# Patient Record
Sex: Female | Born: 1990 | Race: White | Hispanic: No | Marital: Married | Smoking: Never smoker
Health system: Southern US, Community
[De-identification: ages and names within clinical notes are randomized; demographics above are authoritative.]

## PROBLEM LIST (undated history)

## (undated) DIAGNOSIS — K219 Gastro-esophageal reflux disease without esophagitis: Secondary | ICD-10-CM

## (undated) DIAGNOSIS — Z8489 Family history of other specified conditions: Secondary | ICD-10-CM

## (undated) HISTORY — PX: TONSILLECTOMY: SUR1361

---

## 2016-09-11 ENCOUNTER — Encounter: Payer: Self-pay | Admitting: *Deleted

## 2016-09-11 ENCOUNTER — Emergency Department
Admission: EM | Admit: 2016-09-11 | Discharge: 2016-09-11 | Disposition: A | Discharge status: Home / Self Care | Attending: Emergency Medicine | Admitting: Emergency Medicine

## 2016-09-11 DIAGNOSIS — S61254A Open bite of right ring finger without damage to nail, initial encounter: Secondary | ICD-10-CM | POA: Insufficient documentation

## 2016-09-11 DIAGNOSIS — Y999 Unspecified external cause status: Secondary | ICD-10-CM | POA: Insufficient documentation

## 2016-09-11 DIAGNOSIS — T148XXA Other injury of unspecified body region, initial encounter: Secondary | ICD-10-CM

## 2016-09-11 DIAGNOSIS — Y929 Unspecified place or not applicable: Secondary | ICD-10-CM | POA: Diagnosis not present

## 2016-09-11 DIAGNOSIS — Y939 Activity, unspecified: Secondary | ICD-10-CM | POA: Insufficient documentation

## 2016-09-11 DIAGNOSIS — W5589XA Other contact with other mammals, initial encounter: Secondary | ICD-10-CM | POA: Insufficient documentation

## 2016-09-11 NOTE — ED Triage Notes (Addendum)
Patient states that last night she was bit by a baby fox on the right 4th finger. Skin was broken. [facility] was contacted and has the animal. Patient was seen at [facility] and prescribed Amoxicillin -POT.

## 2016-09-11 NOTE — ED Notes (Signed)
See triage note. States she was setting a small fox loose from a cage and she was bitten on finger

## 2016-09-11 NOTE — ED Provider Notes (Signed)
[facility] Emergency Department Provider Note  ____________________________________________  Time seen: Approximately 5:33 PM  I have reviewed the triage vital signs and the nursing notes.   HISTORY  Chief Complaint Animal Bite    HPI Monique Pacheco is a 26 y.o. female presenting to the emergency department after being bitten by a fox. Patient sustained two 0.2 cm abrasion to the right ring finger during the encounter. Patient states that Caryn Section is a baby and was given to her by neighbors. Patient states that she was attempting to give baby Caryn Section a bath when she was bitten. No falls occured during the encounter. Baby Caryn Section has been sequestered and given to animal control for observation. Patient was seen at [facility] clinic yesterday and prescribed Augmentin. She presents to the emergency department for more information regarding rabies vaccination. She denies fever or chills.   History reviewed. No pertinent past medical history.  There are no active problems to display for this patient.   Past Surgical History:  Procedure Laterality Date  . TONSILLECTOMY      Prior to Admission medications   Not on File    Allergies Patient has no known allergies.  No family history on file.  Social History Social History  Substance Use Topics  . Smoking status: Never Smoker  . Smokeless tobacco: Never Used  . Alcohol use Yes     Comment: occasionally     Review of Systems  Constitutional: No fever/chills Eyes: No visual changes. No discharge ENT: No upper respiratory complaints. Cardiovascular: no chest pain. Respiratory: no cough. No SOB. Gastrointestinal: No abdominal pain.  No nausea, no vomiting.  No diarrhea.  No constipation. Musculoskeletal: Negative for musculoskeletal pain. Skin: Patient has animal bite. Neurological: Negative for headaches, focal weakness or numbness..  ____________________________________________   PHYSICAL EXAM:  VITAL SIGNS: ED  Triage Vitals  Enc Vitals Group     BP 09/11/16 1553 116/87     Pulse Rate 09/11/16 1553 82     Resp 09/11/16 1553 16     Temp 09/11/16 1553 98.8 F (37.1 C)     Temp src --      SpO2 09/11/16 1553 98 %     Weight 09/11/16 1555 182 lb (82.6 kg)     Height 09/11/16 1555  (1.702 m)     Head Circumference --      Peak Flow --      Pain Score --      Pain Loc --      Pain Edu? --      Excl. in [place]? --      Constitutional: Alert and oriented. Well appearing and in no acute distress. Eyes: Conjunctivae are normal. PERRL. EOMI. Head: Atraumatic.  Cardiovascular: Normal rate, regular rhythm. Normal S1 and S2.  Good peripheral circulation. Respiratory: Normal respiratory effort without tachypnea or retractions. Lungs CTAB. Good air entry to the bases with no decreased or absent breath sounds. Gastrointestinal: Bowel sounds 4 quadrants. Soft and nontender to palpation. No guarding or rigidity. No palpable masses. No distention. No CVA tenderness. Musculoskeletal: Full range of motion to all extremities. No gross deformities appreciated. Neurologic:  Normal speech and language. No gross focal neurologic deficits are appreciated.  Skin: Patient has two 0.2 cm abrasions along the right ring finger.  Psychiatric: Mood and affect are normal. Speech and behavior are normal. Patient exhibits appropriate insight and judgement.   ____________________________________________   LABS (all labs ordered are listed, but only abnormal results are displayed)  Labs Reviewed - No data to display ____________________________________________  EKG   ____________________________________________  RADIOLOGY   No results found.  ____________________________________________    PROCEDURES  Procedure(s) performed:    Procedures    Medications - No data to display   ____________________________________________   INITIAL IMPRESSION / ASSESSMENT AND PLAN / ED COURSE  Pertinent labs  & imaging results that were available during my care of the patient were reviewed by me and considered in my medical decision making (see chart for details).  Review of the [place] CSRS was performed in accordance of the [facility] prior to dispensing any controlled drugs.     Assessment and plan: Animal bite: Patient presents to the emergency department after she was bitten by a young Fox along the right ring finger last night. Patient has a prescription for Augmentin prescribed by [facility]. Due to the superficial nature of abrasions from bite, DG right hand was not warranted at this time. As Caryn Section has been successfully detained by animal control, observation was recommended. Patient agreed with plan of care and has agreed to follow-up with animal control throughout observation process. Vital signs are reassuring at this time. All patient questions were answered. Return precautions were given. Patient voiced understanding regarding these return precautions. ____________________________________________  FINAL CLINICAL IMPRESSION(S) / ED DIAGNOSES  Final diagnoses:  Animal bite      NEW MEDICATIONS STARTED DURING THIS VISIT:  There are no discharge medications for this patient.       This chart was dictated using voice recognition software/Dragon. Despite best efforts to proofread, errors can occur which can change the meaning. Any change was purely unintentional.    Orvil Feil, PA-C 09/11/16 1743    Rockne Menghini, MD 09/11/16 1901

## 2018-02-06 ENCOUNTER — Other Ambulatory Visit: Payer: Self-pay | Admitting: Chiropractic Medicine

## 2018-02-06 DIAGNOSIS — S93401A Sprain of unspecified ligament of right ankle, initial encounter: Secondary | ICD-10-CM

## 2018-11-02 ENCOUNTER — Observation Stay: Payer: No Typology Code available for payment source | Admitting: Anesthesiology

## 2018-11-02 ENCOUNTER — Encounter
Admission: EM | Disposition: A | Discharge status: Home Health | Payer: Self-pay | Source: Home / Self Care | Attending: Internal Medicine

## 2018-11-02 ENCOUNTER — Emergency Department: Payer: No Typology Code available for payment source

## 2018-11-02 ENCOUNTER — Other Ambulatory Visit: Payer: Self-pay | Admitting: Orthopedic Surgery

## 2018-11-02 ENCOUNTER — Encounter: Payer: Self-pay | Admitting: Anesthesiology

## 2018-11-02 ENCOUNTER — Inpatient Hospital Stay
Admission: EM | Admit: 2018-11-02 | Discharge: 2018-11-06 | DRG: 983 | Disposition: A | Discharge status: Home Health | Payer: No Typology Code available for payment source | Attending: Internal Medicine | Admitting: Internal Medicine

## 2018-11-02 ENCOUNTER — Other Ambulatory Visit: Payer: Self-pay

## 2018-11-02 DIAGNOSIS — Z20828 Contact with and (suspected) exposure to other viral communicable diseases: Secondary | ICD-10-CM | POA: Diagnosis present

## 2018-11-02 DIAGNOSIS — E876 Hypokalemia: Secondary | ICD-10-CM | POA: Diagnosis present

## 2018-11-02 DIAGNOSIS — M79651 Pain in right thigh: Secondary | ICD-10-CM | POA: Diagnosis present

## 2018-11-02 DIAGNOSIS — Z87828 Personal history of other (healed) physical injury and trauma: Secondary | ICD-10-CM

## 2018-11-02 DIAGNOSIS — Z6829 Body mass index (BMI) 29.0-29.9, adult: Secondary | ICD-10-CM | POA: Diagnosis not present

## 2018-11-02 DIAGNOSIS — S7010XA Contusion of unspecified thigh, initial encounter: Secondary | ICD-10-CM

## 2018-11-02 DIAGNOSIS — T79A21A Traumatic compartment syndrome of right lower extremity, initial encounter: Principal | ICD-10-CM | POA: Diagnosis present

## 2018-11-02 DIAGNOSIS — W5532XA Struck by other hoof stock, initial encounter: Secondary | ICD-10-CM

## 2018-11-02 DIAGNOSIS — K5900 Constipation, unspecified: Secondary | ICD-10-CM | POA: Diagnosis not present

## 2018-11-02 DIAGNOSIS — E663 Overweight: Secondary | ICD-10-CM | POA: Diagnosis present

## 2018-11-02 DIAGNOSIS — R52 Pain, unspecified: Secondary | ICD-10-CM | POA: Diagnosis present

## 2018-11-02 DIAGNOSIS — I959 Hypotension, unspecified: Secondary | ICD-10-CM | POA: Diagnosis not present

## 2018-11-02 HISTORY — PX: FASCIOTOMY: SHX132

## 2018-11-02 LAB — BASIC METABOLIC PANEL
Anion gap: 9 (ref 5–15)
BUN: 18 mg/dL (ref 6–20)
CO2: 24 mmol/L (ref 22–32)
Calcium: 8.8 mg/dL — ABNORMAL LOW (ref 8.9–10.3)
Chloride: 109 mmol/L (ref 98–111)
Creatinine, Ser: 1 mg/dL (ref 0.44–1.00)
GFR calc Af Amer: >60 mL/min (ref >60)
GFR calc non Af Amer: >60 mL/min (ref >60)
Glucose, Bld: 126 mg/dL — ABNORMAL HIGH (ref 70–99)
Potassium: 3.2 mmol/L — ABNORMAL LOW (ref 3.5–5.1)
Sodium: 142 mmol/L (ref 135–145)

## 2018-11-02 LAB — CBC WITH DIFFERENTIAL/PLATELET
Abs Immature Granulocytes: 0.03 10*3/uL (ref 0.00–0.07)
Basophils Absolute: 0.1 10*3/uL (ref 0.0–0.1)
Basophils Relative: 1 %
Eosinophils Absolute: 0.2 10*3/uL (ref 0.0–0.5)
Eosinophils Relative: 3 %
HCT: 36.6 % (ref 36.0–46.0)
Hemoglobin: 11.9 g/dL — ABNORMAL LOW (ref 12.0–15.0)
Immature Granulocytes: 0 %
Lymphocytes Relative: 36 %
Lymphs Abs: 3.2 10*3/uL (ref 0.7–4.0)
MCH: 28.4 pg (ref 26.0–34.0)
MCHC: 32.5 g/dL (ref 30.0–36.0)
MCV: 87.4 fL (ref 80.0–100.0)
Monocytes Absolute: 0.7 10*3/uL (ref 0.1–1.0)
Monocytes Relative: 8 %
Neutro Abs: 4.5 10*3/uL (ref 1.7–7.7)
Neutrophils Relative %: 52 %
Platelets: 282 10*3/uL (ref 150–400)
RBC: 4.19 MIL/uL (ref 3.87–5.11)
RDW: 12.7 % (ref 11.5–15.5)
WBC: 8.7 10*3/uL (ref 4.0–10.5)
nRBC: 0 % (ref 0.0–0.2)

## 2018-11-02 LAB — SARS CORONAVIRUS 2 BY RT PCR (HOSPITAL ORDER, PERFORMED IN ~~LOC~~ HOSPITAL LAB): SARS Coronavirus 2: NEGATIVE

## 2018-11-02 LAB — PROTIME-INR
INR: 1 (ref 0.8–1.2)
Prothrombin Time: 12.6 seconds (ref 11.4–15.2)

## 2018-11-02 LAB — POCT PREGNANCY, URINE: Preg Test, Ur: NEGATIVE

## 2018-11-02 LAB — CK: Total CK: 262 U/L — ABNORMAL HIGH (ref 38–234)

## 2018-11-02 SURGERY — FASCIOTOMY, UPPER EXTREMITY
Anesthesia: General | Laterality: Right

## 2018-11-02 MED ORDER — KETOROLAC TROMETHAMINE 30 MG/ML IJ SOLN
INTRAMUSCULAR | Status: AC
  Filled 2018-11-02: qty 1

## 2018-11-02 MED ORDER — KETOROLAC TROMETHAMINE 30 MG/ML IJ SOLN
10.0000 mg | Freq: Once | INTRAMUSCULAR | Status: AC
  Administered 2018-11-02: 02:00:00 9.9 mg via INTRAVENOUS
  Filled 2018-11-02: qty 1

## 2018-11-02 MED ORDER — MIDAZOLAM HCL 2 MG/2ML IJ SOLN
INTRAMUSCULAR | Status: DC | PRN
  Administered 2018-11-02: 2 mg via INTRAVENOUS

## 2018-11-02 MED ORDER — ACETAMINOPHEN 325 MG PO TABS
650.0000 mg | ORAL_TABLET | Freq: Four times a day (QID) | ORAL | Status: DC | PRN
  Administered 2018-11-03 – 2018-11-05 (×5): 650 mg via ORAL
  Filled 2018-11-02 (×5): qty 2

## 2018-11-02 MED ORDER — TRAMADOL HCL 50 MG PO TABS
50.0000 mg | ORAL_TABLET | Freq: Four times a day (QID) | ORAL | Status: DC
  Administered 2018-11-02 – 2018-11-06 (×14): 50 mg via ORAL
  Filled 2018-11-02 (×14): qty 1

## 2018-11-02 MED ORDER — HYDROMORPHONE HCL 1 MG/ML IJ SOLN
INTRAMUSCULAR | Status: AC
  Filled 2018-11-02: qty 1

## 2018-11-02 MED ORDER — FENTANYL CITRATE (PF) 100 MCG/2ML IJ SOLN
INTRAMUSCULAR | Status: AC
  Filled 2018-11-02: qty 2

## 2018-11-02 MED ORDER — ONDANSETRON HCL 4 MG/2ML IJ SOLN
4.0000 mg | Freq: Four times a day (QID) | INTRAMUSCULAR | Status: DC | PRN
  Administered 2018-11-05: 4 mg via INTRAVENOUS

## 2018-11-02 MED ORDER — ACETAMINOPHEN 500 MG PO TABS
1000.0000 mg | ORAL_TABLET | Freq: Once | ORAL | Status: AC
  Administered 2018-11-02: 11:00:00 975 mg via ORAL

## 2018-11-02 MED ORDER — CEFAZOLIN SODIUM-DEXTROSE 1-4 GM/50ML-% IV SOLN
1.0000 g | Freq: Four times a day (QID) | INTRAVENOUS | Status: AC
  Administered 2018-11-02 (×2): 1 g via INTRAVENOUS
  Filled 2018-11-02 (×2): qty 50

## 2018-11-02 MED ORDER — PHENYLEPHRINE HCL (PRESSORS) 10 MG/ML IV SOLN
INTRAVENOUS | Status: AC
  Filled 2018-11-02: qty 1

## 2018-11-02 MED ORDER — OXYCODONE HCL 5 MG PO TABS
5.0000 mg | ORAL_TABLET | Freq: Four times a day (QID) | ORAL | Status: DC | PRN
  Administered 2018-11-02: 5 mg via ORAL
  Filled 2018-11-02: qty 1

## 2018-11-02 MED ORDER — DIAZEPAM 5 MG PO TABS
5.0000 mg | ORAL_TABLET | Freq: Once | ORAL | Status: AC
  Administered 2018-11-02: 02:00:00 5 mg via ORAL
  Filled 2018-11-02: qty 1

## 2018-11-02 MED ORDER — ALUM & MAG HYDROXIDE-SIMETH 200-200-20 MG/5ML PO SUSP: 30.0000 mL | ORAL | Status: DC | PRN

## 2018-11-02 MED ORDER — ACETAMINOPHEN 650 MG RE SUPP: 650.0000 mg | Freq: Four times a day (QID) | RECTAL | Status: DC | PRN

## 2018-11-02 MED ORDER — ACETAMINOPHEN 500 MG PO TABS
1000.0000 mg | ORAL_TABLET | Freq: Four times a day (QID) | ORAL | Status: AC
  Administered 2018-11-02 – 2018-11-03 (×3): 1000 mg via ORAL
  Filled 2018-11-02 (×3): qty 2

## 2018-11-02 MED ORDER — LACTATED RINGERS IV SOLN
INTRAVENOUS | Status: DC
  Administered 2018-11-02: 09:00:00 50 mL/h via INTRAVENOUS

## 2018-11-02 MED ORDER — OXYCODONE-ACETAMINOPHEN 5-325 MG PO TABS
1.0000 | ORAL_TABLET | Freq: Four times a day (QID) | ORAL | Status: DC | PRN
  Administered 2018-11-02: 1 via ORAL
  Filled 2018-11-02: qty 1

## 2018-11-02 MED ORDER — BISACODYL 10 MG RE SUPP
10.0000 mg | Freq: Every day | RECTAL | Status: DC | PRN
  Administered 2018-11-05: 22:00:00 10 mg via RECTAL
  Filled 2018-11-02: qty 1

## 2018-11-02 MED ORDER — OXYCODONE HCL 5 MG PO TABS
10.0000 mg | ORAL_TABLET | ORAL | Status: DC | PRN
  Administered 2018-11-02 – 2018-11-06 (×6): 10 mg via ORAL
  Filled 2018-11-02 (×5): qty 2

## 2018-11-02 MED ORDER — DOCUSATE SODIUM 100 MG PO CAPS
100.0000 mg | ORAL_CAPSULE | Freq: Two times a day (BID) | ORAL | Status: DC
  Filled 2018-11-02: qty 1

## 2018-11-02 MED ORDER — HYDROMORPHONE HCL 1 MG/ML IJ SOLN
1.0000 mg | Freq: Once | INTRAMUSCULAR | Status: AC
  Administered 2018-11-02: 1 mg via INTRAVENOUS

## 2018-11-02 MED ORDER — ONDANSETRON HCL 4 MG PO TABS: 4.0000 mg | ORAL_TABLET | Freq: Four times a day (QID) | ORAL | Status: DC | PRN

## 2018-11-02 MED ORDER — HYDROMORPHONE HCL 1 MG/ML IJ SOLN
1.0000 mg | Freq: Once | INTRAMUSCULAR | Status: AC
  Administered 2018-11-02: 01:00:00 1 mg via INTRAVENOUS

## 2018-11-02 MED ORDER — HYDROMORPHONE HCL 1 MG/ML IJ SOLN: 1.0000 mg | Freq: Once | INTRAMUSCULAR | Status: DC

## 2018-11-02 MED ORDER — OXYCODONE HCL 5 MG PO TABS
5.0000 mg | ORAL_TABLET | ORAL | Status: DC | PRN
  Administered 2018-11-02 – 2018-11-05 (×5): 10 mg via ORAL
  Administered 2018-11-05 (×2): 5 mg via ORAL
  Filled 2018-11-02 (×2): qty 2
  Filled 2018-11-02: qty 1
  Filled 2018-11-02 (×4): qty 2
  Filled 2018-11-02: qty 1

## 2018-11-02 MED ORDER — ONDANSETRON HCL 4 MG/2ML IJ SOLN
4.0000 mg | Freq: Four times a day (QID) | INTRAMUSCULAR | Status: DC | PRN
  Administered 2018-11-02: 4 mg via INTRAVENOUS

## 2018-11-02 MED ORDER — PROPOFOL 10 MG/ML IV BOLUS
INTRAVENOUS | Status: AC
  Filled 2018-11-02: qty 20

## 2018-11-02 MED ORDER — CEFAZOLIN SODIUM-DEXTROSE 1-4 GM/50ML-% IV SOLN
INTRAVENOUS | Status: DC | PRN
  Administered 2018-11-02: 2 g via INTRAVENOUS

## 2018-11-02 MED ORDER — PANTOPRAZOLE SODIUM 40 MG PO TBEC
40.0000 mg | DELAYED_RELEASE_TABLET | Freq: Every day | ORAL | Status: DC
  Administered 2018-11-03 – 2018-11-05 (×3): 40 mg via ORAL
  Filled 2018-11-02 (×3): qty 1

## 2018-11-02 MED ORDER — ONDANSETRON HCL 4 MG/2ML IJ SOLN
INTRAMUSCULAR | Status: AC
  Filled 2018-11-02: qty 2

## 2018-11-02 MED ORDER — HYDROMORPHONE HCL 1 MG/ML IJ SOLN
0.5000 mg | INTRAMUSCULAR | Status: DC | PRN
  Administered 2018-11-02: 11:00:00 0.5 mg via INTRAVENOUS

## 2018-11-02 MED ORDER — FENTANYL CITRATE (PF) 100 MCG/2ML IJ SOLN
25.0000 ug | INTRAMUSCULAR | Status: DC | PRN
  Administered 2018-11-02 (×3): 50 ug via INTRAVENOUS

## 2018-11-02 MED ORDER — FERROUS SULFATE 325 (65 FE) MG PO TABS
325.0000 mg | ORAL_TABLET | Freq: Three times a day (TID) | ORAL | Status: DC
  Administered 2018-11-02 – 2018-11-06 (×9): 325 mg via ORAL
  Filled 2018-11-02 (×10): qty 1

## 2018-11-02 MED ORDER — LIDOCAINE HCL (CARDIAC) PF 100 MG/5ML IV SOSY
PREFILLED_SYRINGE | INTRAVENOUS | Status: DC | PRN
  Administered 2018-11-02: 100 mg via INTRAVENOUS

## 2018-11-02 MED ORDER — MENTHOL 3 MG MT LOZG
1.0000 | LOZENGE | OROMUCOSAL | Status: DC | PRN
  Filled 2018-11-02: qty 9

## 2018-11-02 MED ORDER — HYDROMORPHONE HCL 1 MG/ML IJ SOLN
INTRAMUSCULAR | Status: AC
  Administered 2018-11-02: 21:00:00 1 mg
  Filled 2018-11-02: qty 1

## 2018-11-02 MED ORDER — POTASSIUM CHLORIDE CRYS ER 20 MEQ PO TBCR
40.0000 meq | EXTENDED_RELEASE_TABLET | Freq: Once | ORAL | Status: AC
  Administered 2018-11-02: 40 meq via ORAL
  Filled 2018-11-02: qty 2

## 2018-11-02 MED ORDER — DEXAMETHASONE SODIUM PHOSPHATE 10 MG/ML IJ SOLN
INTRAMUSCULAR | Status: DC | PRN
  Administered 2018-11-02: 8 mg via INTRAVENOUS

## 2018-11-02 MED ORDER — DOCUSATE SODIUM 100 MG PO CAPS
100.0000 mg | ORAL_CAPSULE | Freq: Two times a day (BID) | ORAL | Status: DC
  Administered 2018-11-02 – 2018-11-06 (×8): 100 mg via ORAL
  Filled 2018-11-02 (×8): qty 1

## 2018-11-02 MED ORDER — DEXAMETHASONE SODIUM PHOSPHATE 10 MG/ML IJ SOLN
INTRAMUSCULAR | Status: AC
  Filled 2018-11-02: qty 1

## 2018-11-02 MED ORDER — ONDANSETRON HCL 4 MG/2ML IJ SOLN: 4.0000 mg | Freq: Once | INTRAMUSCULAR | Status: DC | PRN

## 2018-11-02 MED ORDER — METHOCARBAMOL 1000 MG/10ML IJ SOLN
500.0000 mg | Freq: Four times a day (QID) | INTRAVENOUS | Status: DC | PRN
  Filled 2018-11-02: qty 5

## 2018-11-02 MED ORDER — MORPHINE SULFATE (PF) 4 MG/ML IV SOLN
4.0000 mg | INTRAVENOUS | Status: DC | PRN
  Administered 2018-11-02 (×2): 4 mg via INTRAVENOUS
  Filled 2018-11-02 (×2): qty 1

## 2018-11-02 MED ORDER — ACETAMINOPHEN 325 MG PO TABS
ORAL_TABLET | ORAL | Status: AC
  Filled 2018-11-02: qty 3

## 2018-11-02 MED ORDER — FENTANYL CITRATE (PF) 100 MCG/2ML IJ SOLN
INTRAMUSCULAR | Status: DC | PRN
  Administered 2018-11-02: 100 ug via INTRAVENOUS

## 2018-11-02 MED ORDER — MAGNESIUM CITRATE PO SOLN
1.0000 | Freq: Once | ORAL | Status: DC | PRN
  Filled 2018-11-02: qty 296

## 2018-11-02 MED ORDER — MIDAZOLAM HCL 2 MG/2ML IJ SOLN
INTRAMUSCULAR | Status: AC
  Filled 2018-11-02: qty 2

## 2018-11-02 MED ORDER — KETOROLAC TROMETHAMINE 30 MG/ML IJ SOLN
30.0000 mg | Freq: Once | INTRAMUSCULAR | Status: AC
  Administered 2018-11-02: 11:00:00 30 mg via INTRAVENOUS

## 2018-11-02 MED ORDER — POLYETHYLENE GLYCOL 3350 17 G PO PACK: 17.0000 g | PACK | Freq: Every day | ORAL | Status: DC | PRN

## 2018-11-02 MED ORDER — HYDROMORPHONE HCL 1 MG/ML IJ SOLN
0.5000 mg | INTRAMUSCULAR | Status: DC | PRN
  Administered 2018-11-02 – 2018-11-03 (×6): 1 mg via INTRAVENOUS
  Filled 2018-11-02 (×6): qty 1

## 2018-11-02 MED ORDER — KETOROLAC TROMETHAMINE 15 MG/ML IJ SOLN
7.5000 mg | Freq: Four times a day (QID) | INTRAMUSCULAR | Status: AC
  Administered 2018-11-02 – 2018-11-03 (×3): 7.5 mg via INTRAVENOUS
  Filled 2018-11-02 (×3): qty 1

## 2018-11-02 MED ORDER — METHOCARBAMOL 500 MG PO TABS
500.0000 mg | ORAL_TABLET | Freq: Four times a day (QID) | ORAL | Status: DC | PRN
  Administered 2018-11-02 – 2018-11-05 (×6): 500 mg via ORAL
  Filled 2018-11-02 (×8): qty 1

## 2018-11-02 MED ORDER — ONDANSETRON HCL 4 MG/2ML IJ SOLN: 4.0000 mg | Freq: Once | INTRAMUSCULAR | Status: DC

## 2018-11-02 MED ORDER — ENOXAPARIN SODIUM 40 MG/0.4ML ~~LOC~~ SOLN
40.0000 mg | SUBCUTANEOUS | Status: DC
  Administered 2018-11-03: 40 mg via SUBCUTANEOUS
  Filled 2018-11-02: qty 0.4

## 2018-11-02 MED ORDER — ONDANSETRON HCL 4 MG/2ML IJ SOLN
4.0000 mg | Freq: Once | INTRAMUSCULAR | Status: AC
  Administered 2018-11-02: 01:00:00 4 mg via INTRAVENOUS

## 2018-11-02 MED ORDER — SODIUM CHLORIDE 0.9 % IV BOLUS
1000.0000 mL | Freq: Once | INTRAVENOUS | Status: AC
  Administered 2018-11-02: 1000 mL via INTRAVENOUS

## 2018-11-02 MED ORDER — PHENOL 1.4 % MT LIQD
1.0000 | OROMUCOSAL | Status: DC | PRN
  Filled 2018-11-02: qty 177

## 2018-11-02 SURGICAL SUPPLY — 42 items
BANDAGE ACE 6X5 VEL STRL LF (GAUZE/BANDAGES/DRESSINGS) ×3 IMPLANT
BLADE SURG 15 STRL LF DISP TIS (BLADE) ×1 IMPLANT
BLADE SURG 15 STRL SS (BLADE) ×2
BNDG ESMARK 4X12 TAN STRL LF (GAUZE/BANDAGES/DRESSINGS) IMPLANT
CANISTER SUCT 1200ML W/VALVE (MISCELLANEOUS) ×3 IMPLANT
CANISTER SUCT 3000ML PPV (MISCELLANEOUS) ×3 IMPLANT
CANISTER WOUND CARE 500ML ATS (WOUND CARE) ×3 IMPLANT
CAST PADDING 3X4FT ST 30246 (SOFTGOODS)
COVER WAND RF STERILE (DRAPES) ×3 IMPLANT
CUFF TOURN SGL QUICK 24 (TOURNIQUET CUFF)
CUFF TOURN SGL QUICK 30 (TOURNIQUET CUFF)
CUFF TRNQT CYL 24X4X16.5-23 (TOURNIQUET CUFF) IMPLANT
CUFF TRNQT CYL 30X4X21-28X (TOURNIQUET CUFF) IMPLANT
DRAPE SURG 17X11 SM STRL (DRAPES) ×6 IMPLANT
DRAPE U-SHAPE 47X51 STRL (DRAPES) IMPLANT
DRAPE WOUND VAC 10X15X1CM (MISCELLANEOUS) ×3 IMPLANT
DRSG VAC ATS LRG SENSATRAC (GAUZE/BANDAGES/DRESSINGS) ×3 IMPLANT
DURAPREP 26ML APPLICATOR (WOUND CARE) ×9 IMPLANT
ELECT REM PT RETURN 9FT ADLT (ELECTROSURGICAL) ×3
ELECTRODE REM PT RTRN 9FT ADLT (ELECTROSURGICAL) ×1 IMPLANT
GAUZE SPONGE 4X4 12PLY STRL (GAUZE/BANDAGES/DRESSINGS) IMPLANT
GAUZE XEROFORM 1X8 LF (GAUZE/BANDAGES/DRESSINGS) IMPLANT
GLOVE BIOGEL PI IND STRL 9 (GLOVE) ×1 IMPLANT
GLOVE BIOGEL PI INDICATOR 9 (GLOVE) ×2
GLOVE SURG 9.0 ORTHO LTXF (GLOVE) ×9 IMPLANT
GOWN STRL REUS TWL 2XL XL LVL4 (GOWN DISPOSABLE) ×3 IMPLANT
GOWN STRL REUS W/ TWL LRG LVL3 (GOWN DISPOSABLE) ×1 IMPLANT
GOWN STRL REUS W/TWL LRG LVL3 (GOWN DISPOSABLE) ×2
MAT ABSORB  FLUID 56X50 GRAY (MISCELLANEOUS)
MAT ABSORB FLUID 56X50 GRAY (MISCELLANEOUS) IMPLANT
NS IRRIG 1000ML POUR BTL (IV SOLUTION) ×3 IMPLANT
PACK EXTREMITY ARMC (MISCELLANEOUS) IMPLANT
PACK HIP PROSTHESIS (MISCELLANEOUS) ×3 IMPLANT
PAD ABD DERMACEA PRESS 5X9 (GAUZE/BANDAGES/DRESSINGS) ×3 IMPLANT
PAD CAST CTTN 3X4 STRL (SOFTGOODS) IMPLANT
PULSAVAC PLUS IRRIG FAN TIP (DISPOSABLE)
SET MONITOR QUICK PRESSURE (MISCELLANEOUS) ×3 IMPLANT
SOL .9 NS 3000ML IRR  AL (IV SOLUTION)
SOL .9 NS 3000ML IRR UROMATIC (IV SOLUTION) IMPLANT
STOCKINETTE BIAS CUT 6 980064 (GAUZE/BANDAGES/DRESSINGS) ×3 IMPLANT
STOCKINETTE STRL 4IN 9604848 (GAUZE/BANDAGES/DRESSINGS) IMPLANT
TIP FAN IRRIG PULSAVAC PLUS (DISPOSABLE) IMPLANT
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
## 2018-11-02 NOTE — ED Notes (Signed)
ED Provider at bedside. 
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
## 2018-11-02 NOTE — Anesthesia Postprocedure Evaluation (Signed)
Anesthesia Post Note  Patient: Monique Pacheco  Procedure(s) Performed: FASCIOTOMY RIGHT THIGH (Right )  Patient location during evaluation: PACU Anesthesia Type: General Level of consciousness: awake and alert Pain management: pain level controlled Vital Signs Assessment: post-procedure vital signs reviewed and stable Respiratory status: spontaneous breathing, nonlabored ventilation, respiratory function stable and patient connected to nasal cannula oxygen Cardiovascular status: blood pressure returned to baseline and stable Postop Assessment: no apparent nausea or vomiting Anesthetic complications: no     Last Vitals:  Vitals:   11/02/18 1110 11/02/18 1148  BP: 104/69 115/81  Pulse: 62 60  Resp:  (!) 21  Temp: (!) 36.4 C   SpO2: 100% 100%    Last Pain:  Vitals:   11/02/18 1303  TempSrc:   PainSc: 9         RLE Motor Response: Purposeful movement (11/02/18 1254) RLE Sensation: Pain (11/02/18 1254)      Leslyn Monda S
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
## 2018-11-02 NOTE — Anesthesia Procedure Notes (Signed)
Procedure Name: LMA Insertion Date/Time: 11/02/2018 9:24 AM Performed by: Rona Ravens, CRNA Pre-anesthesia Checklist: Patient identified, Emergency Drugs available, Suction available and Patient being monitored Patient Re-evaluated:Patient Re-evaluated prior to induction Oxygen Delivery Method: Circle system utilized Preoxygenation: Pre-oxygenation with 100% oxygen Induction Type: IV induction Ventilation: Mask ventilation without difficulty LMA: LMA inserted LMA Size: 4.5 Number of attempts: 1 Dental Injury: Teeth and Oropharynx as per pre-operative assessment

## 2018-11-02 NOTE — Plan of Care (Signed)
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
## 2018-11-02 NOTE — Anesthesia Post-op Follow-up Note (Signed)
Anesthesia QCDR form completed.        
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
## 2018-11-02 NOTE — Transfer of Care (Signed)
Immediate Anesthesia Transfer of Care Note  Patient: Monique Pacheco  Procedure(s) Performed: FASCIOTOMY RIGHT THIGH (Right )  Patient Location: PACU  Anesthesia Type:General  Level of Consciousness: awake, alert  and oriented  Airway & Oxygen Therapy: Patient Spontanous Breathing and Patient connected to face mask oxygen  Post-op Assessment: Report given to RN and Post -op Vital signs reviewed and stable  Post vital signs: Reviewed and stable  Last Vitals:  Vitals Value Taken Time  BP 129/63 11/02/18 1015  Temp    Pulse 88 11/02/18 1016  Resp    SpO2 100 % 11/02/18 1016  Vitals shown include unvalidated device data.  Last Pain:  Vitals:   11/02/18 0808  TempSrc:   PainSc: 7       Patients Stated Pain Goal: 1 (59/93/57 0177)  Complications: No apparent anesthesia complications

## 2018-11-02 NOTE — Anesthesia Preprocedure Evaluation (Signed)
Anesthesia Evaluation  Patient identified by MRN, date of birth, ID band Patient awake    Reviewed: Allergy & Precautions, NPO status , Patient's Chart, lab work & pertinent test results, reviewed documented beta blocker date and time   Airway Mallampati: III  TM Distance: >3 FB     Dental  (+) Chipped   Pulmonary           Cardiovascular      Neuro/Psych    GI/Hepatic   Endo/Other    Renal/GU      Musculoskeletal   Abdominal   Peds  Hematology   Anesthesia Other Findings   Reproductive/Obstetrics                             Anesthesia Physical Anesthesia Plan  ASA: II  Anesthesia Plan: General   Post-op Pain Management:    Induction: Intravenous  PONV Risk Score and Plan:   Airway Management Planned: LMA  Additional Equipment:   Intra-op Plan:   Post-operative Plan:   Informed Consent: I have reviewed the patients History and Physical, chart, labs and discussed the procedure including the risks, benefits and alternatives for the proposed anesthesia with the patient or authorized representative who has indicated his/her understanding and acceptance.       Plan Discussed with: CRNA  Anesthesia Plan Comments:         Anesthesia Quick Evaluation  
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
## 2018-11-03 ENCOUNTER — Encounter: Payer: Self-pay | Admitting: Orthopedic Surgery

## 2018-11-03 LAB — BASIC METABOLIC PANEL
Anion gap: 9 (ref 5–15)
BUN: 12 mg/dL (ref 6–20)
CO2: 21 mmol/L — ABNORMAL LOW (ref 22–32)
Calcium: 8.1 mg/dL — ABNORMAL LOW (ref 8.9–10.3)
Chloride: 108 mmol/L (ref 98–111)
Creatinine, Ser: 0.8 mg/dL (ref 0.44–1.00)
GFR calc Af Amer: >60 mL/min (ref >60)
GFR calc non Af Amer: >60 mL/min (ref >60)
Glucose, Bld: 121 mg/dL — ABNORMAL HIGH (ref 70–99)
Potassium: 4.4 mmol/L (ref 3.5–5.1)
Sodium: 138 mmol/L (ref 135–145)

## 2018-11-03 LAB — CBC
HCT: 30.3 % — ABNORMAL LOW (ref 36.0–46.0)
Hemoglobin: 9.9 g/dL — ABNORMAL LOW (ref 12.0–15.0)
MCH: 28.6 pg (ref 26.0–34.0)
MCHC: 32.7 g/dL (ref 30.0–36.0)
MCV: 87.6 fL (ref 80.0–100.0)
Platelets: 222 10*3/uL (ref 150–400)
RBC: 3.46 MIL/uL — ABNORMAL LOW (ref 3.87–5.11)
RDW: 12.5 % (ref 11.5–15.5)
WBC: 11.4 10*3/uL — ABNORMAL HIGH (ref 4.0–10.5)
nRBC: 0 % (ref 0.0–0.2)

## 2018-11-03 MED ORDER — SODIUM CHLORIDE 0.9 % IV BOLUS
500.0000 mL | Freq: Once | INTRAVENOUS | Status: AC
  Administered 2018-11-03: 500 mL via INTRAVENOUS

## 2018-11-03 MED ORDER — SODIUM CHLORIDE 0.9 % IV SOLN
INTRAVENOUS | Status: DC
  Administered 2018-11-03 – 2018-11-06 (×4): via INTRAVENOUS

## 2018-11-03 MED ORDER — CEFAZOLIN SODIUM-DEXTROSE 1-4 GM/50ML-% IV SOLN
1.0000 g | Freq: Three times a day (TID) | INTRAVENOUS | Status: DC
  Administered 2018-11-03 – 2018-11-06 (×9): 1 g via INTRAVENOUS
  Filled 2018-11-03 (×11): qty 50

## 2018-11-03 MED ORDER — POLYETHYLENE GLYCOL 3350 17 G PO PACK
17.0000 g | PACK | Freq: Every day | ORAL | Status: DC
  Administered 2018-11-03 – 2018-11-05 (×2): 17 g via ORAL
  Filled 2018-11-03 (×3): qty 1
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
## 2018-11-03 NOTE — Progress Notes (Signed)
Notified Dr. Estanislado Pandy of patient's BP and being dizzy. Order received for normal saline bolus 500 ml and Normal saline at 158ml/hr

## 2018-11-03 NOTE — Progress Notes (Signed)
Notified Dr. Donney Rankins of patient's complaint that "something doesn't feel right." patient has good pedal pulses and able to wiggler her toes. Telephone call transferred into room for MD to speak with patient.
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
## 2018-11-03 NOTE — Progress Notes (Signed)
[employer] - [place] [facility]   PATIENT NAME: Monique Pacheco    MR#:  [id number]  DATE OF BIRTH:  06/18/1990  SUBJECTIVE:  CHIEF COMPLAINT:   Chief Complaint  Patient presents with  . Leg Injury  Patient seen and evaluated today Patient has pain in the Right thigh Swelling in the right thigh better No complaints of chest pain and shortness of breath  REVIEW OF SYSTEMS:    ROS  CONSTITUTIONAL: No documented fever. No fatigue, weakness. No weight gain, no weight loss.  EYES: No blurry or double vision.  ENT: No tinnitus. No postnasal drip. No redness of the oropharynx.  RESPIRATORY: No cough, no wheeze, no hemoptysis. No dyspnea.  CARDIOVASCULAR: No chest pain. No orthopnea. No palpitations. No syncope.  GASTROINTESTINAL: No nausea, no vomiting or diarrhea. No abdominal pain. No melena or hematochezia.  GENITOURINARY: No dysuria or hematuria.  ENDOCRINE: No polyuria or nocturia. No heat or cold intolerance.  HEMATOLOGY: No anemia. No bruising. No bleeding.  INTEGUMENTARY: No rashes. No lesions.  MUSCULOSKELETAL: No arthritis. No swelling. No gout.  Tenderness right thigh NEUROLOGIC: No numbness, tingling, or ataxia. No seizure-type activity.  PSYCHIATRIC: No anxiety. No insomnia. No ADD.   DRUG ALLERGIES:  No Known Allergies  VITALS:  Blood pressure 137/68, pulse 71, temperature 97.7 F (36.5 C), resp. rate 20, height 5\' 7"  (1.702 m), weight 86 kg, last menstrual period 11/02/2018, SpO2 100 %.  PHYSICAL EXAMINATION:   Physical Exam  GENERAL:  28 y.o.-year-old patient lying in the bed with no acute distress.  EYES: Pupils equal, round, reactive to light and accommodation. No scleral icterus. Extraocular muscles intact.  HEENT: Head atraumatic, normocephalic. Oropharynx and nasopharynx clear.  NECK:  Supple, no jugular venous distention. No thyroid enlargement, no tenderness.  LUNGS: Normal breath sounds bilaterally, no wheezing, rales, rhonchi. No use  of accessory muscles of respiration.  CARDIOVASCULAR: S1, S2 normal. No murmurs, rubs, or gallops.  ABDOMEN: Soft, nontender, nondistended. Bowel sounds present. No organomegaly or mass.  EXTREMITIES: No cyanosis, clubbing or edema b/l.    Swelling and tenderness in the right thigh Right thigh bandage and drain noted. Pedal pulses present NEUROLOGIC: Cranial nerves II through XII are intact. No focal Motor or sensory deficits b/l.   PSYCHIATRIC: The patient is alert and oriented x 3.  SKIN: Right thigh bandage noted.  LABORATORY PANEL:   CBC Recent Labs  Lab 11/03/18 0255  WBC 11.4*  HGB 9.9*  HCT 30.3*  PLT 222   ------------------------------------------------------------------------------------------------------------------ Chemistries  Recent Labs  Lab 11/03/18 0255  NA 138  K 4.4  CL 108  CO2 21*  GLUCOSE 121*  BUN 12  CREATININE 0.80  CALCIUM 8.1*   ------------------------------------------------------------------------------------------------------------------  Cardiac Enzymes No results for input(s): TROPONINI in the last 168 hours. ------------------------------------------------------------------------------------------------------------------  RADIOLOGY:  Dg Femur Portable Min 2 Views Right  Result Date: 11/02/2018 CLINICAL DATA:  Kicked by camel EXAM: RIGHT FEMUR PORTABLE 2 VIEW COMPARISON:  None. FINDINGS: There is no evidence of fracture or other focal bone lesions. Soft tissues are unremarkable. IMPRESSION: Negative. Electronically Signed   By: Charlett NoseKevin  Dover M.D.   On: 11/02/2018 01:19     ASSESSMENT AND PLAN:   28 year old female patient with no significant past medical history currently under hospitalist service for hematoma of the thigh  -Right thigh compartment syndrome S/p Fasciotomy Status post orthopedic surgery evaluation  -Acute hypokalemia resolved  -Right thigh pain secondary to compartment syndrome IV dilaudid as needed for pain  and prn oral narcotics  -DVT prophylaxis sequential compression device to lower extremities  -COVID-19 test negative  All the records are reviewed and case discussed with Care Management/Social Worker. Management plans discussed with the patient, family and they are in agreement.  CODE STATUS: Full code  DVT Prophylaxis: SCDs  TOTAL TIME TAKING CARE OF THIS PATIENT: 34 minutes.   POSSIBLE D/C IN 2 to 3 DAYS, DEPENDING ON CLINICAL CONDITION.  Saundra Shelling M.D on 11/03/2018 at 9:53 AM  Between 7am to 6pm - Pager - [phone]  After 6pm go to [email or web address] - password [email or web address] [facility]  Office  [phone]  CC: Primary care physician; Clinic, [facility]  Note: This dictation was prepared with Dragon dictation along with smaller phrase technology. Any transcriptional errors that result from this process are unintentional.

## 2018-11-03 NOTE — Progress Notes (Signed)
MD Pyreddy stated verbally that it was okay to give patient dilaudid as needed because compartment syndrome surgery is very painful

## 2018-11-03 NOTE — Progress Notes (Signed)
Subjective:  Called by patient's nurse regarding numerous complaints by the patient.  Patient is complaining of dizziness and has been given a bolus and started on 100 cc/h maintenance fluids.  Patient is complaining of pain in her right thigh.  Patient states she has difficulty getting comfortable.  She is receiving narcotics which do help her pain but may be contributing to her decrease in pressure.    Objective:   VITALS:   Vitals:   11/03/18 0613 11/03/18 0743 11/03/18 1441 11/03/18 1639  BP: 127/72 137/68 (!) 104/50 104/66  Pulse: 89 71 (!) 57 66  Resp: 20 20 16    Temp: 98.2 F (36.8 C) 97.7 F (36.5 C) 98 F (36.7 C)   TempSrc: Oral  Oral   SpO2: 99% 100% 98%   Weight: 86 kg     Height:        PHYSICAL EXAM: Right lower extremity: Patient's thigh and leg compartments are soft and compressible.  Her right thigh remains swollen but is improved compared to her preop condition.  The VAC dressing is in place and is functioning.  Patient continues to have tenderness to palpation over her quadriceps globally.  She has palpable pedal pulses.  Patient can flex and extend her toes and can gently dorsiflex and plantarflex her ankle.  Her toes are well-perfused.  She has intact sensation light touch but states that she feels tingling in her toes.   LABS  Results for orders placed or performed during the hospital encounter of 11/02/18 (from the past 24 hour(s))  CBC     Status: Abnormal   Collection Time: 11/03/18  2:55 AM  Result Value Ref Range   WBC 11.4 (H) 4.0 - 10.5 K/uL   RBC 3.46 (L) 3.87 - 5.11 MIL/uL   Hemoglobin 9.9 (L) 12.0 - 15.0 g/dL   HCT 16.130.3 (L) 09.636.0 - 04.546.0 %   MCV 87.6 80.0 - 100.0 fL   MCH 28.6 26.0 - 34.0 pg   MCHC 32.7 30.0 - 36.0 g/dL   RDW 40.912.5 81.111.5 - 91.415.5 %   Platelets 222 150 - 400 K/uL   nRBC 0.0 0.0 - 0.2 %  Basic metabolic panel     Status: Abnormal   Collection Time: 11/03/18  2:55 AM  Result Value Ref Range   Sodium 138 135 - 145 mmol/L   Potassium 4.4 3.5 - 5.1 mmol/L   Chloride 108 98 - 111 mmol/L   CO2 21 (L) 22 - 32 mmol/L   Glucose, Bld 121 (H) 70 - 99 mg/dL   BUN 12 6 - 20 mg/dL   Creatinine, Ser 7.820.80 0.44 - 1.00 mg/dL   Calcium 8.1 (L) 8.9 - 10.3 mg/dL   GFR calc non Af Amer >60 >60 mL/min   GFR calc Af Amer >60 >60 mL/min   Anion gap 9 5 - 15    Dg Femur Portable Min 2 Views Right  Result Date: 11/02/2018 CLINICAL DATA:  Kicked by camel EXAM: RIGHT FEMUR PORTABLE 2 VIEW COMPARISON:  None. FINDINGS: There is no evidence of fracture or other focal bone lesions. Soft tissues are unremarkable. IMPRESSION: Negative. Electronically Signed   By: Charlett NoseKevin  Dover M.D.   On: 11/02/2018 01:19    Assessment/Plan: 1 Day Post-Op   Active Problems:   Intractable pain  Patient continues to deal with significant right lower extremity pain.  There is no clinical evidence of compartment syndrome in the thigh or leg at this time.  Her compartments are soft on exam.  She remains neurovascular intact.  I explained to the patient that she had significant ecchymosis throughout the quadriceps and the muscles swelling from the trauma is likely was causing her pain.  She is written for narcotics and muscle relaxants to help with pain.  I will put physical therapy/Occupational Therapy on hold for now in an effort to improve her pain.  Patient may apply ice to her right lower extremity to improve pain and swelling.  Continue close neurovascular monitoring by the nursing staff.    Thornton Park , MD 11/03/2018, 6:31 PM

## 2018-11-04 LAB — BASIC METABOLIC PANEL
Anion gap: 5 (ref 5–15)
BUN: 13 mg/dL (ref 6–20)
CO2: 24 mmol/L (ref 22–32)
Calcium: 7.6 mg/dL — ABNORMAL LOW (ref 8.9–10.3)
Chloride: 112 mmol/L — ABNORMAL HIGH (ref 98–111)
Creatinine, Ser: 0.88 mg/dL (ref 0.44–1.00)
GFR calc Af Amer: >60 mL/min (ref >60)
GFR calc non Af Amer: >60 mL/min (ref >60)
Glucose, Bld: 104 mg/dL — ABNORMAL HIGH (ref 70–99)
Potassium: 3.8 mmol/L (ref 3.5–5.1)
Sodium: 141 mmol/L (ref 135–145)

## 2018-11-04 LAB — CBC
HCT: 25.8 % — ABNORMAL LOW (ref 36.0–46.0)
Hemoglobin: 8.1 g/dL — ABNORMAL LOW (ref 12.0–15.0)
MCH: 28.6 pg (ref 26.0–34.0)
MCHC: 31.4 g/dL (ref 30.0–36.0)
MCV: 91.2 fL (ref 80.0–100.0)
Platelets: 181 10*3/uL (ref 150–400)
RBC: 2.83 MIL/uL — ABNORMAL LOW (ref 3.87–5.11)
RDW: 12.8 % (ref 11.5–15.5)
WBC: 6.5 10*3/uL (ref 4.0–10.5)
nRBC: 0 % (ref 0.0–0.2)

## 2018-11-04 NOTE — TOC Progression Note (Signed)
Transition of Care [facility]) - Progression Note    Patient Details  Name: Monique Pacheco MRN: [id number] Date of Birth: November 27, 1990  Transition of Care [facility]) CM/SW [place], RN Phone Number: 11/04/2018, 2:09 PM  Clinical Narrative:     The patient called the [facility] and obtained the Social Worker contact Ralene Bathe, [phone] ext [phone], she stated that her last name at the [place] is Jimmye [place] since they have not updated her name using her married name.  I called Marlon at the number above, I left a detailed secure [place] mail explaining that I was trying to set up the patient with Home health Services and I need to know if they have a Agency that the patient is required to use or not and if so, who is it and contact information, I requested a call back and provided my contact information  Expected Discharge Plan: [place] Barriers to Discharge: Continued Medical Work up  Expected Discharge Plan and Services Expected Discharge Plan: [place]   Discharge Planning Services: CM Consult Post Acute Care Choice: [place] arrangements for the past 2 months: Single Family Home Expected Discharge Date: 11/04/18               DME Arranged: Berta Minor rolling DME Agency: [employer] Date DME Agency Contacted: 11/04/18 Time DME Agency Contacted: 1330 Representative spoke with at DME Agency: Leroy Sea HH Arranged: PT, OT           Social Determinants of Health ([place]) Interventions    Readmission Risk Interventions No flowsheet data found.

## 2018-11-04 NOTE — Progress Notes (Signed)
Monique Pacheco at [facility] NAME: Monique Pacheco    MR#:  [id number]  DATE OF BIRTH:  25-Aug-1990  SUBJECTIVE:  CHIEF COMPLAINT:   Chief Complaint  Patient presents with  . Leg Injury  Patient seen and evaluated today Patient has pain in the Right thigh, but the pain is much better Also dizzy and hypotensive yesterday and was started on IV fluids Swelling in the right thigh better No complaints of chest pain and shortness of breath  Drainage in the wound VAC is coming down  REVIEW OF SYSTEMS:    ROS  CONSTITUTIONAL: No documented fever. No fatigue, weakness. No weight gain, no weight loss.  EYES: No blurry or double vision.  ENT: No tinnitus. No postnasal drip. No redness of the oropharynx.  RESPIRATORY: No cough, no wheeze, no hemoptysis. No dyspnea.  CARDIOVASCULAR: No chest pain. No orthopnea. No palpitations. No syncope.  GASTROINTESTINAL: No nausea, no vomiting or diarrhea. No abdominal pain. No melena or hematochezia.  GENITOURINARY: No dysuria or hematuria.  ENDOCRINE: No polyuria or nocturia. No heat or cold intolerance.  HEMATOLOGY: No anemia. No bruising. No bleeding.  INTEGUMENTARY: No rashes. No lesions.  MUSCULOSKELETAL: No arthritis. No swelling. No gout.  Tenderness right thigh NEUROLOGIC: No numbness, tingling, or ataxia. No seizure-type activity.  PSYCHIATRIC: No anxiety. No insomnia. No ADD.   DRUG ALLERGIES:  No Known Allergies  VITALS:  Blood pressure 109/63, pulse 69, temperature 97.7 F (36.5 C), temperature source Oral, resp. rate 18, height 5\' 7"  (1.702 m), weight 98.4 kg, last menstrual period 11/02/2018, SpO2 99 %.  PHYSICAL EXAMINATION:   Physical Exam  GENERAL:  28 y.o.-year-old patient lying in the bed with no acute distress.  EYES: Pupils equal, round, reactive to light and accommodation. No scleral icterus. Extraocular muscles intact.  HEENT: Head atraumatic, normocephalic. Oropharynx and nasopharynx clear.   NECK:  Supple, no jugular venous distention. No thyroid enlargement, no tenderness.  LUNGS: Normal breath sounds bilaterally, no wheezing, rales, rhonchi. No use of accessory muscles of respiration.  CARDIOVASCULAR: S1, S2 normal. No murmurs, rubs, or gallops.  ABDOMEN: Soft, nontender, nondistended. Bowel sounds present. No organomegaly or mass.  EXTREMITIES: No cyanosis, clubbing or edema b/l.    Swelling and tenderness in the right thigh Right thigh bandage and drain noted. Wound VAC noted Pedal pulses present NEUROLOGIC: Cranial nerves II through XII are intact. No focal Motor or sensory deficits b/l.   PSYCHIATRIC: The patient is alert and oriented x 3.  SKIN: Right thigh bandage noted.  LABORATORY PANEL:   CBC Recent Labs  Lab 11/04/18 0553  WBC 6.5  HGB 8.1*  HCT 25.8*  PLT 181   ------------------------------------------------------------------------------------------------------------------ Chemistries  Recent Labs  Lab 11/04/18 0553  NA 141  K 3.8  CL 112*  CO2 24  GLUCOSE 104*  BUN 13  CREATININE 0.88  CALCIUM 7.6*   ------------------------------------------------------------------------------------------------------------------  Cardiac Enzymes No results for input(s): TROPONINI in the last 168 hours. ------------------------------------------------------------------------------------------------------------------  RADIOLOGY:  No results found.   ASSESSMENT AND PLAN:   28 year old female patient with no significant past medical history currently under hospitalist service for hematoma of the thigh  -Right thigh ecchymosis and hematoma from trauma Neurovascular intact S/p Fasciotomy No evidence of compartment syndrome as per orthopedics Status post orthopedic surgery evaluation Physical therapy and Occupational Therapy as per orthopedic attending  -Dizziness and hypotension Started on IV fluids Blood pressure improving  -Acute hypokalemia  resolved  -Right thigh pain secondary to  hematoma from trauma IV dilaudid as needed for pain and prn oral narcotics  -DVT prophylaxis sequential compression device to lower extremities  -COVID-19 test negative  -Constipation Continue oral Miralax  All the records are reviewed and case discussed with Care Management/Social Worker. Management plans discussed with the patient, family and they are in agreement.  CODE STATUS: Full code  DVT Prophylaxis: SCDs  TOTAL TIME TAKING CARE OF THIS PATIENT: 33 minutes.   POSSIBLE D/C IN 2 to 3 DAYS, DEPENDING ON CLINICAL CONDITION.  Ihor AustinPavan Hillard Goodwine M.D on 11/04/2018 at 10:27 AM  Between 7am to 6pm - Pager - [phone]  After 6pm go to [email or web address] - password [email or web address] [facility]  [facility] [place] [facility]  Office  [phone]  CC: Primary care physician; [facility]  Note: This dictation was prepared with Dragon dictation along with smaller phrase technology. Any transcriptional errors that result from this process are unintentional.

## 2018-11-04 NOTE — Plan of Care (Signed)

## 2018-11-04 NOTE — TOC Initial Note (Signed)
Transition of Care [facility]) - Initial/Assessment Note    Patient Details  Name: Monique Pacheco MRN: [id number] Date of Birth: 1990/11/01  Transition of Care [facility]) CM/SW Contact:    Su Hilt, RN Phone Number: 11/04/2018, 1:31 PM  Clinical Narrative:                 Met with the patient to discuss DC plan and needs She lives with her spouse on a farm where they tend to the animals She has transportation with her husband when she is unable to drive She will need a RW and 3 in 1, I notified Brad with [employer] She stated that she has the [place] provide any needed medication as well as any other services, she is going to call them to find out if they cover Home health services and what [facility] [employer] agency they need to use, she will let me know  He uses the [place] as a PCP as well Will get [place] set up once the patient notified me of which agency she needs to use,  DME will be brought to the room before DC Will continue to Monitor for additional needs  Expected Discharge Plan: [place] Barriers to Discharge: Continued Medical Work up   Patient Goals and CMS Choice Patient states their goals for this hospitalization and ongoing recovery are:: go home with Home health   Choice offered to / list presented to : Patient  Expected Discharge Plan and Services Expected Discharge Plan: [place]   Discharge Planning Services: CM Consult Post Acute Care Choice: [place] arrangements for the past 2 months: Single Family Home Expected Discharge Date: 11/04/18               DME Arranged: Berta Minor rolling DME Agency: [employer] Date DME Agency Contacted: 11/04/18 Time DME Agency Contacted: 1330 Representative spoke with at DME Agency: Leroy Sea [place] Arranged: PT, OT          Prior Living Arrangements/Services Living arrangements for the past 2 months: [place] with:: Spouse Patient language and need for interpreter reviewed:: No Do  you feel safe going back to the place where you live?: Yes      Need for Family Participation in Patient Care: No (Comment) Care giver support system in place?: Yes (comment)   Criminal Activity/Legal Involvement Pertinent to Current Situation/Hospitalization: No - Comment as needed  Activities of Daily Living Home Assistive Devices/Equipment: None ADL Screening (condition at time of admission) Patient's cognitive ability adequate to safely complete daily activities?: Yes Is the patient deaf or have difficulty hearing?: No Does the patient have difficulty seeing, even when wearing glasses/contacts?: No Does the patient have difficulty concentrating, remembering, or making decisions?: No Patient able to express need for assistance with ADLs?: Yes Does the patient have difficulty dressing or bathing?: No Independently performs ADLs?: Yes (appropriate for developmental age) Does the patient have difficulty walking or climbing stairs?: No Weakness of Legs: Right Weakness of Arms/Hands: None  Permission Sought/Granted Permission sought to share information with : Case Manager Permission granted to share information with : Yes, Verbal Permission Granted     Permission granted to share info w AGENCY: HH agency        Emotional Assessment Appearance:: Appears stated age Attitude/Demeanor/Rapport: Engaged Affect (typically observed): Accepting Orientation: : Oriented to Place, Oriented to  Time, Oriented to Situation, Oriented to Self Alcohol / Substance Use: Not Applicable Psych Involvement: No (comment)  Admission diagnosis:  Intractable pain [R52] Contusion of anterior thigh, initial encounter [S70.10XA] Patient Active Problem List   Diagnosis Date Noted  . Intractable pain 11/02/2018   PCP:  Clinic, [place]:   [facility] [address], [place] [address] [place] [address] Phone: [phone] Fax: [phone]     Social  Determinants of Health (SDOH) Interventions    Readmission Risk Interventions No flowsheet data found.

## 2018-11-04 NOTE — Progress Notes (Signed)
  Subjective:  Patient seen at 2 pm today.  POD #2 s/p right thigh fasciotomy.   Patient reports right thigh pain has improved.   Feels her pain is coming from wound vac.    Objective:   VITALS:   Vitals:   11/04/18 0300 11/04/18 0344 11/04/18 0728 11/04/18 1548  BP:  107/67 109/63 113/70  Pulse:  69 69 61  Resp:   18 18  Temp:   97.7 F (36.5 C) 98.3 F (36.8 C)  TempSrc:   Oral Oral  SpO2:  100% 99% 99%  Weight: 98.4 kg     Height:        PHYSICAL EXAM: Right lower extremity Neurovascular intact Sensation intact distally Intact pulses distally Dorsiflexion/Plantar flexion intact Incision: dressing C/D/I No cellulitis present Compartment soft  LABS  Results for orders placed or performed during the hospital encounter of 11/02/18 (from the past 24 hour(s))  CBC     Status: Abnormal   Collection Time: 11/04/18  5:53 AM  Result Value Ref Range   WBC 6.5 4.0 - 10.5 K/uL   RBC 2.83 (L) 3.87 - 5.11 MIL/uL   Hemoglobin 8.1 (L) 12.0 - 15.0 g/dL   HCT 25.8 (L) 36.0 - 46.0 %   MCV 91.2 80.0 - 100.0 fL   MCH 28.6 26.0 - 34.0 pg   MCHC 31.4 30.0 - 36.0 g/dL   RDW 12.8 11.5 - 15.5 %   Platelets 181 150 - 400 K/uL   nRBC 0.0 0.0 - 0.2 %  Basic metabolic panel     Status: Abnormal   Collection Time: 11/04/18  5:53 AM  Result Value Ref Range   Sodium 141 135 - 145 mmol/L   Potassium 3.8 3.5 - 5.1 mmol/L   Chloride 112 (H) 98 - 111 mmol/L   CO2 24 22 - 32 mmol/L   Glucose, Bld 104 (H) 70 - 99 mg/dL   BUN 13 6 - 20 mg/dL   Creatinine, Ser 0.88 0.44 - 1.00 mg/dL   Calcium 7.6 (L) 8.9 - 10.3 mg/dL   GFR calc non Af Amer >60 >60 mL/min   GFR calc Af Amer >60 >60 mL/min   Anion gap 5 5 - 15    No results found.  Assessment/Plan: 2 Days Post-Op   Active Problems:   Intractable pain  Patient will be NPO after midnight.   Plan for return to OR tomorrow for possible wound closure vs. Wound vac change.  Patient will have lovenox d/c'd in preparation for surgery in the AM.   Continue kefzol.   Continue wound vac.  PT/OT on hold.    Thornton Park , MD 11/04/2018, 5:54 PM

## 2018-11-05 ENCOUNTER — Inpatient Hospital Stay: Payer: No Typology Code available for payment source | Admitting: Certified Registered Nurse Anesthetist

## 2018-11-05 ENCOUNTER — Encounter
Admission: EM | Disposition: A | Discharge status: Home Health | Payer: Self-pay | Source: Home / Self Care | Attending: Internal Medicine

## 2018-11-05 ENCOUNTER — Encounter: Payer: Self-pay | Admitting: Anesthesiology

## 2018-11-05 HISTORY — PX: INCISION AND DRAINAGE: SHX5863

## 2018-11-05 LAB — CBC
HCT: 27.2 % — ABNORMAL LOW (ref 36.0–46.0)
Hemoglobin: 8.9 g/dL — ABNORMAL LOW (ref 12.0–15.0)
MCH: 29 pg (ref 26.0–34.0)
MCHC: 32.7 g/dL (ref 30.0–36.0)
MCV: 88.6 fL (ref 80.0–100.0)
Platelets: 219 10*3/uL (ref 150–400)
RBC: 3.07 MIL/uL — ABNORMAL LOW (ref 3.87–5.11)
RDW: 12.7 % (ref 11.5–15.5)
WBC: 5.7 10*3/uL (ref 4.0–10.5)
nRBC: 0 % (ref 0.0–0.2)

## 2018-11-05 SURGERY — INCISION AND DRAINAGE
Anesthesia: General | Site: Thigh | Laterality: Right

## 2018-11-05 MED ORDER — HYDROMORPHONE HCL 1 MG/ML IJ SOLN
INTRAMUSCULAR | Status: DC | PRN
  Administered 2018-11-05 (×2): 0.5 mg via INTRAVENOUS

## 2018-11-05 MED ORDER — SUCCINYLCHOLINE CHLORIDE 20 MG/ML IJ SOLN
INTRAMUSCULAR | Status: AC
  Filled 2018-11-05: qty 1

## 2018-11-05 MED ORDER — LIDOCAINE HCL (PF) 2 % IJ SOLN
INTRAMUSCULAR | Status: AC
  Filled 2018-11-05: qty 10

## 2018-11-05 MED ORDER — ONDANSETRON HCL 4 MG/2ML IJ SOLN
INTRAMUSCULAR | Status: AC
  Filled 2018-11-05: qty 2

## 2018-11-05 MED ORDER — PROMETHAZINE HCL 25 MG/ML IJ SOLN: 6.2500 mg | INTRAMUSCULAR | Status: DC | PRN

## 2018-11-05 MED ORDER — FENTANYL CITRATE (PF) 100 MCG/2ML IJ SOLN
INTRAMUSCULAR | Status: AC
  Filled 2018-11-05: qty 2

## 2018-11-05 MED ORDER — ROCURONIUM BROMIDE 50 MG/5ML IV SOLN
INTRAVENOUS | Status: AC
  Filled 2018-11-05: qty 1

## 2018-11-05 MED ORDER — PROPOFOL 10 MG/ML IV BOLUS
INTRAVENOUS | Status: DC | PRN
  Administered 2018-11-05: 200 mg via INTRAVENOUS

## 2018-11-05 MED ORDER — MIDAZOLAM HCL 2 MG/2ML IJ SOLN
INTRAMUSCULAR | Status: DC | PRN
  Administered 2018-11-05: 2 mg via INTRAVENOUS

## 2018-11-05 MED ORDER — GENTAMICIN SULFATE 40 MG/ML IJ SOLN
INTRAMUSCULAR | Status: AC
  Filled 2018-11-05: qty 2

## 2018-11-05 MED ORDER — LIDOCAINE HCL (CARDIAC) PF 100 MG/5ML IV SOSY
PREFILLED_SYRINGE | INTRAVENOUS | Status: DC | PRN
  Administered 2018-11-05: 100 mg via INTRAVENOUS

## 2018-11-05 MED ORDER — CEFAZOLIN SODIUM-DEXTROSE 1-4 GM/50ML-% IV SOLN: 1.0000 g | INTRAVENOUS | Status: DC

## 2018-11-05 MED ORDER — MIDAZOLAM HCL 2 MG/2ML IJ SOLN
INTRAMUSCULAR | Status: AC
  Filled 2018-11-05: qty 2

## 2018-11-05 MED ORDER — DEXAMETHASONE SODIUM PHOSPHATE 10 MG/ML IJ SOLN
INTRAMUSCULAR | Status: DC | PRN
  Administered 2018-11-05: 10 mg via INTRAVENOUS

## 2018-11-05 MED ORDER — GENTAMICIN SULFATE 40 MG/ML IJ SOLN
INTRAMUSCULAR | Status: AC
  Filled 2018-11-05: qty 4

## 2018-11-05 MED ORDER — SODIUM CHLORIDE 0.9 % IV SOLN
INTRAVENOUS | Status: DC | PRN
  Administered 2018-11-05: 6 mL

## 2018-11-05 MED ORDER — FENTANYL CITRATE (PF) 100 MCG/2ML IJ SOLN
INTRAMUSCULAR | Status: DC | PRN
  Administered 2018-11-05 (×2): 50 ug via INTRAVENOUS

## 2018-11-05 MED ORDER — DEXAMETHASONE SODIUM PHOSPHATE 10 MG/ML IJ SOLN
INTRAMUSCULAR | Status: AC
  Filled 2018-11-05: qty 1

## 2018-11-05 MED ORDER — FENTANYL CITRATE (PF) 100 MCG/2ML IJ SOLN
25.0000 ug | INTRAMUSCULAR | Status: DC | PRN
  Administered 2018-11-05 (×2): 50 ug via INTRAVENOUS

## 2018-11-05 MED ORDER — HYDROMORPHONE HCL 1 MG/ML IJ SOLN
INTRAMUSCULAR | Status: AC
  Filled 2018-11-05: qty 1

## 2018-11-05 MED ORDER — PROPOFOL 10 MG/ML IV BOLUS
INTRAVENOUS | Status: AC
  Filled 2018-11-05: qty 20

## 2018-11-05 SURGICAL SUPPLY — 53 items
BNDG COHESIVE 6X5 TAN STRL LF (GAUZE/BANDAGES/DRESSINGS) ×3 IMPLANT
CANISTER SUCT 1200ML W/VALVE (MISCELLANEOUS) ×3 IMPLANT
COVER WAND RF STERILE (DRAPES) ×3 IMPLANT
CUFF TOURN SGL QUICK 18X4 (TOURNIQUET CUFF) IMPLANT
CUFF TOURN SGL QUICK 24 (TOURNIQUET CUFF)
CUFF TRNQT CYL 24X4X16.5-23 (TOURNIQUET CUFF) IMPLANT
DRAPE INCISE IOBAN 66X60 STRL (DRAPES) ×3 IMPLANT
DRAPE SHEET LG 3/4 BI-LAMINATE (DRAPES) ×3 IMPLANT
DRAPE SURG 17X11 SM STRL (DRAPES) ×6 IMPLANT
DRAPE U-SHAPE 47X51 STRL (DRAPES) ×3 IMPLANT
DRSG OPSITE POSTOP 4X12 (GAUZE/BANDAGES/DRESSINGS) IMPLANT
DRSG OPSITE POSTOP 4X14 (GAUZE/BANDAGES/DRESSINGS) IMPLANT
DURAPREP 26ML APPLICATOR (WOUND CARE) ×6 IMPLANT
ELECT CAUTERY BLADE 6.4 (BLADE) ×3 IMPLANT
ELECT REM PT RETURN 9FT ADLT (ELECTROSURGICAL) ×3
ELECTRODE REM PT RTRN 9FT ADLT (ELECTROSURGICAL) ×1 IMPLANT
GAUZE SPONGE 4X4 12PLY STRL (GAUZE/BANDAGES/DRESSINGS) ×9 IMPLANT
GAUZE XEROFORM 1X8 LF (GAUZE/BANDAGES/DRESSINGS) ×3 IMPLANT
GLOVE BIOGEL PI IND STRL 7.5 (GLOVE) ×4 IMPLANT
GLOVE BIOGEL PI IND STRL 9 (GLOVE) ×1 IMPLANT
GLOVE BIOGEL PI INDICATOR 7.5 (GLOVE) ×8
GLOVE BIOGEL PI INDICATOR 9 (GLOVE) ×2
GLOVE SURG 9.0 ORTHO LTXF (GLOVE) ×9 IMPLANT
GOWN STRL REUS TWL 2XL XL LVL4 (GOWN DISPOSABLE) ×3 IMPLANT
GOWN STRL REUS W/ TWL LRG LVL3 (GOWN DISPOSABLE) ×2 IMPLANT
GOWN STRL REUS W/TWL LRG LVL3 (GOWN DISPOSABLE) ×4
HEMOVAC 400ML (MISCELLANEOUS) ×3
IMMBOLIZER KNEE 19 BLUE UNIV (SOFTGOODS) ×3 IMPLANT
KIT DRAIN HEMOVAC JP 7FR 400ML (MISCELLANEOUS) ×1 IMPLANT
KIT TURNOVER KIT A (KITS) ×3 IMPLANT
NDL SAFETY ECLIPSE 18X1.5 (NEEDLE) ×1 IMPLANT
NEEDLE FILTER BLUNT 18X 1/2SAF (NEEDLE) ×2
NEEDLE FILTER BLUNT 18X1 1/2 (NEEDLE) ×1 IMPLANT
NEEDLE HYPO 18GX1.5 SHARP (NEEDLE) ×2
NS IRRIG 1000ML POUR BTL (IV SOLUTION) ×9 IMPLANT
NS IRRIG 500ML POUR BTL (IV SOLUTION) ×3 IMPLANT
PACK EXTREMITY ARMC (MISCELLANEOUS) ×3 IMPLANT
PAD ABD DERMACEA PRESS 5X9 (GAUZE/BANDAGES/DRESSINGS) ×12 IMPLANT
STAPLER SKIN PROX 35W (STAPLE) ×3 IMPLANT
STOCKINETTE BIAS CUT 6 980064 (GAUZE/BANDAGES/DRESSINGS) IMPLANT
SUT ETHIBOND #5 BRAIDED 30INL (SUTURE) ×3 IMPLANT
SUT TICRON 2-0 30IN 311381 (SUTURE) ×3 IMPLANT
SUT VIC AB 0 CT1 18XCR BRD 8 (SUTURE) ×1 IMPLANT
SUT VIC AB 0 CT1 27 (SUTURE) ×2
SUT VIC AB 0 CT1 27XCR 8 STRN (SUTURE) ×1 IMPLANT
SUT VIC AB 0 CT1 36 (SUTURE) ×3 IMPLANT
SUT VIC AB 0 CT1 8-18 (SUTURE) ×2
SUT VIC AB 1 CTX 27 (SUTURE) ×3 IMPLANT
SUT VIC AB 2-0 CT1 27 (SUTURE) ×6
SUT VIC AB 2-0 CT1 TAPERPNT 27 (SUTURE) ×3 IMPLANT
SYR 10ML LL (SYRINGE) ×3 IMPLANT
TAPE MICROFOAM 4IN (TAPE) ×3 IMPLANT
TOWEL OR 17X26 4PK STRL BLUE (TOWEL DISPOSABLE) ×3 IMPLANT

## 2018-11-05 NOTE — Op Note (Signed)
11/05/2018  10:52 AM  PATIENT:  Monique Pacheco    PRE-OPERATIVE DIAGNOSIS:  Open right thigh fasciotomy wound  POST-OPERATIVE DIAGNOSIS:  Same  PROCEDURE:  Right fasciotomy wound closure  SURGEON:  Thornton Park, MD  ANESTHESIA:   General  PREOPERATIVE INDICATIONS:  [name] is a  28 y.o. female with a diagnosis of right thigh fasciotomy on Monday for compartment syndrome who is brought back to the OR today for wound check and possible closure..    I discussed the risks and benefits of surgery. The risks include but are not limited to infection, bleeding, nerve or blood vessel injury, joint stiffness or loss of motion, persistent pain, weakness or instability, tear of the fascia closure, wound dehiscence and the need for further surgery. Medical risks include but are not limited to DVT and pulmonary embolism, myocardial infarction, stroke, pneumonia, respiratory failure and death. Patient understood these risks and wished to proceed.   OPERATIVE FINDINGS: Right quadriceps muscle is well-perfused and viable.  Muscle fibers are contractile.  No evidence of necrosis.  OPERATIVE PROCEDURE: Patient was brought to the operating room today after being seen in the preoperative area.  She underwent general anesthesia and was positioned supine on the operative table.  All bony prominences were adequately padded.  Patient was prepped and draped in a sterile fashion.  A timeout was performed to verify the patient's name, date of birth, medical record number, correct site of surgery and correct procedure to be performed.  Patient has been on Kefzol on the floor while her fasciotomy incision was open.  Patient had the KCI vacuum unit turned off.  Her VAC dressing was removed.  The muscle was well perfused and without the necrosis.  The muscle fibers were contractile.  The muscle was soft.  The wound was copiously irrigated with gentamicin infused saline x 2 liters.    The IT band was mobilized from  overlying adipose tissue.  All bleeding vessels were cauterized.  The IT band was then closed with interrupted 0 Vicryl sutures.  The muscle easily reduced inside the IT band without undue tension on the repair.  The wound was then again copiously irrigated with 1 L of gentamicin infused saline.  Subcutaneous tissues were closed in 2 layers with 0 Vicryl and 2-0 Vicryl.  The skin was approximated staples.  Dry sterile dressing was applied.  Patient had soft thigh compartments upon closure.    A knee immobilizer was placed on the right lower extremity to prevent knee flexion.  Patient was brought to the PACU in stable condition.  I spoke with the patient's husband via phone due to [place] restrictions on visitors.  I explained to him the patient's fasciotomy incision was successfully closed and his wife stable in the recovery room.

## 2018-11-05 NOTE — Anesthesia Preprocedure Evaluation (Signed)
Anesthesia Evaluation  Patient identified by MRN, date of birth, ID band Patient awake    Reviewed: Allergy & Precautions, H&P , NPO status , Patient's Chart, lab work & pertinent test results, reviewed documented beta blocker date and time   History of Anesthesia Complications Negative for: history of anesthetic complications  Airway Mallampati: III  TM Distance: >3 FB Neck ROM: full    Dental no notable dental hx. (+) Dental Advidsory Given, Teeth Intact   Pulmonary neg pulmonary ROS,           Cardiovascular Exercise Tolerance: Good negative cardio ROS       Neuro/Psych negative neurological ROS  negative psych ROS   GI/Hepatic negative GI ROS, Neg liver ROS,   Endo/Other  negative endocrine ROS  Renal/GU negative Renal ROS  negative genitourinary   Musculoskeletal   Abdominal   Peds  Hematology negative hematology ROS (+)   Anesthesia Other Findings History reviewed. No pertinent past medical history.   Reproductive/Obstetrics negative OB ROS                             Anesthesia Physical  Anesthesia Plan  ASA: II  Anesthesia Plan: General   Post-op Pain Management:    Induction: Intravenous  PONV Risk Score and Plan: 3 and Ondansetron, Dexamethasone, Midazolam, Promethazine and Treatment may vary due to age or medical condition  Airway Management Planned: LMA  Additional Equipment:   Intra-op Plan:   Post-operative Plan: Extubation in OR  Informed Consent: I have reviewed the patients History and Physical, chart, labs and discussed the procedure including the risks, benefits and alternatives for the proposed anesthesia with the patient or authorized representative who has indicated his/her understanding and acceptance.     Dental Advisory Given  Plan Discussed with: CRNA  Anesthesia Plan Comments:         Anesthesia Quick Evaluation

## 2018-11-05 NOTE — Anesthesia Postprocedure Evaluation (Signed)
Anesthesia Post Note  Patient: Myalynn Lingle  Procedure(s) Performed: Right fasciotomy closure (Right Thigh)  Patient location during evaluation: PACU Anesthesia Type: General Level of consciousness: awake and alert Pain management: pain level controlled Vital Signs Assessment: post-procedure vital signs reviewed and stable Respiratory status: spontaneous breathing, nonlabored ventilation, respiratory function stable and patient connected to nasal cannula oxygen Cardiovascular status: blood pressure returned to baseline and stable Postop Assessment: no apparent nausea or vomiting Anesthetic complications: no     Last Vitals:  Vitals:   11/05/18 1301 11/05/18 1405  BP: 113/64 110/63  Pulse: 65 85  Resp: 17 17  Temp: 37.1 C 36.7 C  SpO2: 97% 100%    Last Pain:  Vitals:   11/05/18 1405  TempSrc: Oral  PainSc:                  Martha Clan

## 2018-11-05 NOTE — Anesthesia Procedure Notes (Signed)
Procedure Name: LMA Insertion Date/Time: 11/05/2018 8:02 AM Performed by: Johnna Acosta, CRNA Pre-anesthesia Checklist: Patient identified, Emergency Drugs available, Suction available, Patient being monitored and Timeout performed Patient Re-evaluated:Patient Re-evaluated prior to induction Oxygen Delivery Method: Circle system utilized Preoxygenation: Pre-oxygenation with 100% oxygen Induction Type: IV induction LMA: LMA inserted LMA Size: 4.0 Tube type: Oral Number of attempts: 1 Placement Confirmation: positive ETCO2 and breath sounds checked- equal and bilateral Tube secured with: Tape Dental Injury: Teeth and Oropharynx as per pre-operative assessment

## 2018-11-05 NOTE — Evaluation (Signed)
Physical Therapy Evaluation Patient Details Name: Monique Pacheco MRN: [id number] DOB: 12/22/1990 Today's Date: 11/05/2018   History of Present Illness  Monique Pacheco is a 28yoF who comes to [facility] with progress RT thigh swelling and edema. Pt unable to AMB after a trample from a camel named 'Bugsy.' Pt denies head trauma or LOC. Ortho recs for emergent Right lateral thigh fasciotomy.  Clinical Impression  Pt admitted with above diagnosis. Pt currently with functional limitations due to the deficits listed below (see "PT Problem List"). Upon entry, pt in bed, awake and agreeable to participate even though she remains sleepy s/p anesthesia/pain meds. The pt is alert and oriented x3, pleasant, conversational, and generally a good historian.Pt continues to have high levels of pain, slightly improved from prior to surgery, 7/10 at rest. BP is stable from supine to standing. Supervision for bed mobility and transfers. MinGuard assist for AMB as sharp spikes in pain can interfere with balance. The KI in place makes a 2-point gait challenging as the pt cannot maintain the RLE floating posteriorly.   Functional mobility assessment demonstrates increased effort/time requirements, poor tolerance, and need for physical assistance, whereas the patient performed these at a higher level of independence PTA. Will work on stairs performance tomorrow prior to DC in hopes that pain is better controlled. Pt will benefit from skilled PT intervention to increase independence and safety with basic mobility in preparation for discharge to the venue listed below.      11/05/18 1414  Therapy Vitals  Patient Position (if appropriate) Orthostatic Vitals  Orthostatic Lying   BP- Lying 110/61  Pulse- Lying 96  Orthostatic Sitting  BP- Sitting 124/82  Pulse- Sitting 95  Orthostatic Standing at 0 minutes  BP- Standing at 0 minutes 128/82  Pulse- Standing at 0 minutes 103        Follow Up Recommendations Follow surgeon's  recommendation for DC plan and follow-up therapies;Home health PT;Supervision for mobility/OOB    Equipment Recommendations  Standard walker    Recommendations for Other Services       Precautions / Restrictions Precautions Precautions: (Pt is post-op this date.) Precaution Comments: KI donned x 7days per patient Required Braces or Orthoses: Knee Immobilizer - Right Knee Immobilizer - Right: On at all times Restrictions RLE Weight Bearing: Weight bearing as tolerated      Mobility  Bed Mobility Overal bed mobility: Needs Assistance Bed Mobility: Supine to Sit     Supine to sit: Supervision     General bed mobility comments: Pt assists RLE with Left foot  Transfers Overall transfer level: Modified independent Equipment used: Rolling walker (2 wheeled)             General transfer comment: good strength and control. Able to bear weight and relax arm for standing BP  Ambulation/Gait   Gait Distance (Feet): 34 Feet Assistive device: Rolling walker (2 wheeled) Gait Pattern/deviations: WFL(Within Functional Limits)     General Gait Details: 2-point hop-to gait with RLE NWB 2/2 pain (pt permitted to WBAT); more difficult than 2DA as KI limits positioning of knee posteriorly during swing phase, causes more hip flexor activation which is painful.  Stairs            Wheelchair Mobility    Modified Rankin (Stroke Patients Only)       Balance Overall balance assessment: Modified Independent  Pertinent Vitals/Pain Pain Score: 7  Pain Location: R thigh with bed mobility (7/10 at rest), much higher with mobility. 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, kangaroos, and mini cows     Hand Dominance        Extremity/Trunk Assessment                Communication   Communication: No difficulties  Cognition Arousal/Alertness: Awake/alert Behavior During Therapy: WFL for tasks assessed/performed Overall Cognitive Status: Within Functional Limits for tasks assessed                                        General Comments      Exercises     Assessment/Plan    PT Assessment Patient needs continued PT services  PT Problem List Decreased strength;Decreased range of motion;Decreased activity tolerance;Decreased mobility;Decreased knowledge of use of DME       PT Treatment Interventions DME instruction;Gait training;Stair training;Functional mobility training;Therapeutic activities;Therapeutic exercise;Patient/family education    PT Goals (Current goals can be found in the Care Plan section)  Acute Rehab PT Goals Patient Stated Goal: not gain weight from inactivity, return to normal PT Goal Formulation: With patient Time For Goal Achievement: 11/17/18 Potential to Achieve Goals: Good    Frequency 7X/week   Barriers to discharge        Co-evaluation               AM-PAC PT "6 Clicks" Mobility  Outcome Measure Help needed turning from your back to your side while in a flat bed without using bedrails?: None Help needed moving from lying on your back to sitting on the side of a flat bed without using bedrails?: None Help needed moving to and from a bed to a chair (including a wheelchair)?: A Little Help needed standing up from a  chair using your arms (e.g., wheelchair or bedside chair)?: A Little Help needed to walk in hospital room?: A Little Help needed climbing 3-5 steps with a railing? : A Little 6 Click Score: 20    End of Session Equipment Utilized During Treatment: Gait belt Activity Tolerance: Patient tolerated treatment well;Patient limited by pain Patient left: with nursing/sitter in room;Other (comment)(on BSC with NA in room) Nurse Communication: Mobility status PT Visit Diagnosis: Difficulty in walking, not elsewhere classified (R26.2);Muscle weakness (generalized) (M62.81);Pain Pain - Right/Left: Right Pain - part of body: Hip;Knee    Time: 5027-7412 PT Time Calculation (min) (ACUTE ONLY): 19 min   Charges:   PT Evaluation $PT Eval Low Complexity: 1 Low PT Treatments $Gait Training: 8-22 mins        3:13 PM, 11/05/18 Etta Grandchild, PT, DPT Physical Therapist - [facility]  [phone] ([place])    [place] C 11/05/2018, 3:08 PM

## 2018-11-05 NOTE — Progress Notes (Signed)
[facility] - [place] [facility]   PATIENT NAME: Monique Pacheco    MR#:  [id number]  DATE OF BIRTH:  07/06/1990  SUBJECTIVE:  CHIEF COMPLAINT:   Chief Complaint  Patient presents with  . Leg Injury  Patient seen and evaluated today Patient has pain in the Right thigh, but the pain is much better Also dizzy and hypotensive yesterday and was started on IV fluids Swelling in the right thigh better No complaints of chest pain and shortness of breath  Drainage in the wound VAC is coming down  REVIEW OF SYSTEMS:    ROS  CONSTITUTIONAL: No documented fever. No fatigue, weakness. No weight gain, no weight loss.  EYES: No blurry or double vision.  ENT: No tinnitus. No postnasal drip. No redness of the oropharynx.  RESPIRATORY: No cough, no wheeze, no hemoptysis. No dyspnea.  CARDIOVASCULAR: No chest pain. No orthopnea. No palpitations. No syncope.  GASTROINTESTINAL: No nausea, no vomiting or diarrhea. No abdominal pain. No melena or hematochezia.  GENITOURINARY: No dysuria or hematuria.  ENDOCRINE: No polyuria or nocturia. No heat or cold intolerance.  HEMATOLOGY: No anemia. No bruising. No bleeding.  INTEGUMENTARY: No rashes. No lesions.  MUSCULOSKELETAL: No arthritis. No swelling. No gout.  Tenderness right thigh NEUROLOGIC: No numbness, tingling, or ataxia. No seizure-type activity.  PSYCHIATRIC: No anxiety. No insomnia. No ADD.   DRUG ALLERGIES:  No Known Allergies  VITALS:  Blood pressure 113/64, pulse 65, temperature 98.7 F (37.1 C), temperature source Oral, resp. rate 17, height 5\' 7"  (1.702 m), weight 95.1 kg, last menstrual period 11/02/2018, SpO2 97 %.  PHYSICAL EXAMINATION:   Physical Exam  GENERAL:  28 y.o.-year-old patient lying in the bed with no acute distress.  EYES: Pupils equal, round, reactive to light and accommodation. No scleral icterus. Extraocular muscles intact.  HEENT: Head atraumatic, normocephalic. Oropharynx and nasopharynx clear.   NECK:  Supple, no jugular venous distention. No thyroid enlargement, no tenderness.  LUNGS: Normal breath sounds bilaterally, no wheezing, rales, rhonchi. No use of accessory muscles of respiration.  CARDIOVASCULAR: S1, S2 normal. No murmurs, rubs, or gallops.  ABDOMEN: Soft, nontender, nondistended. Bowel sounds present. No organomegaly or mass.  EXTREMITIES: No cyanosis, clubbing or edema b/l.    Swelling and tenderness in the right thigh Right thigh bandage and drain noted. Wound VAC noted Pedal pulses present NEUROLOGIC: Cranial nerves II through XII are intact. No focal Motor or sensory deficits b/l.   PSYCHIATRIC: The patient is alert and oriented x 3.  SKIN: Right thigh bandage noted.  LABORATORY PANEL:   CBC Recent Labs  Lab 11/05/18 0418  WBC 5.7  HGB 8.9*  HCT 27.2*  PLT 219   ------------------------------------------------------------------------------------------------------------------ Chemistries  Recent Labs  Lab 11/04/18 0553  NA 141  K 3.8  CL 112*  CO2 24  GLUCOSE 104*  BUN 13  CREATININE 0.88  CALCIUM 7.6*   ------------------------------------------------------------------------------------------------------------------  Cardiac Enzymes No results for input(s): TROPONINI in the last 168 hours. ------------------------------------------------------------------------------------------------------------------  RADIOLOGY:  No results found.   ASSESSMENT AND PLAN:   28 year old female patient with no significant past medical history currently under hospitalist service for hematoma of the thigh  -Right thigh ecchymosis and hematoma from trauma Compartment syndrome Neurovascular intact S/p Fasciotomy Status post orthopedic surgery evaluation Physical therapy and Occupational Therapy as per orthopedic attending  -Dizziness and hypotension Started on IV fluids Blood pressure improving  -Acute hypokalemia resolved  -Right thigh pain  secondary to hematoma from trauma IV dilaudid as  needed for pain and prn oral narcotics  -DVT prophylaxis sequential compression device to lower extremities  -COVID-19 test negative  -Constipation Continue oral Miralax  All the records are reviewed and case discussed with Care Management/Social Worker. Management plans discussed with the patient, family and they are in agreement.  CODE STATUS: Full code  DVT Prophylaxis: SCDs  TOTAL TIME TAKING CARE OF THIS PATIENT: 33 minutes.   POSSIBLE D/C IN 2 to 3 DAYS, DEPENDING ON CLINICAL CONDITION.  Saundra Shelling M.D on 11/05/2018 at 1:02 PM  Between 7am to 6pm - Pager - [phone]  After 6pm go to [email or web address] - password [email or web address] [facility]  Office  [phone]  CC: Primary care physician; Clinic, [facility]  Note: This dictation was prepared with Dragon dictation along with smaller phrase technology. Any transcriptional errors that result from this process are unintentional.

## 2018-11-05 NOTE — Progress Notes (Signed)
  Subjective:  POST-OP CHECK:  Patient using toilet.  Told patient I would see her again in the AM.  She wants a suppository.  Objective:   VITALS:   Vitals:   11/05/18 1405 11/05/18 1536 11/05/18 1559 11/05/18 1934  BP: 110/63 (!) 102/54 102/64 117/62  Pulse: 85 86 80 76  Resp: 17 17 18 18   Temp: 98.1 F (36.7 C) 98.2 F (36.8 C) 98.2 F (36.8 C) 98.5 F (36.9 C)  TempSrc: Oral Oral Oral Oral  SpO2: 100% 99% 99% 98%  Weight:      Height:        PHYSICAL EXAM: Deferred.  LABS  Results for orders placed or performed during the hospital encounter of 11/02/18 (from the past 24 hour(s))  CBC     Status: Abnormal   Collection Time: 11/05/18  4:18 AM  Result Value Ref Range   WBC 5.7 4.0 - 10.5 K/uL   RBC 3.07 (L) 3.87 - 5.11 MIL/uL   Hemoglobin 8.9 (L) 12.0 - 15.0 g/dL   HCT 27.2 (L) 36.0 - 46.0 %   MCV 88.6 80.0 - 100.0 fL   MCH 29.0 26.0 - 34.0 pg   MCHC 32.7 30.0 - 36.0 g/dL   RDW 12.7 11.5 - 15.5 %   Platelets 219 150 - 400 K/uL   nRBC 0.0 0.0 - 0.2 %    No results found.  Assessment/Plan: Day of Surgery   Active Problems:   Intractable pain  Continue PT.  Patient should wear immobilizer at all times.  May open immobilizer to apply ice to right thigh.  Possible discharge tomorrow if pain controlled.   Suppository already ordered.      [place] Park , MD 11/05/2018, 8:58 PM

## 2018-11-05 NOTE — Anesthesia Post-op Follow-up Note (Signed)
Anesthesia QCDR form completed.        

## 2018-11-05 NOTE — Progress Notes (Signed)
[place] at [place] NAME: Monique Pacheco    MR#:  [id number]  DATE OF BIRTH:  08/29/1990  SUBJECTIVE:  CHIEF COMPLAINT:   Chief Complaint  Patient presents with  . Leg Injury  Patient seen and evaluated today Patient revisited OR for closure of the fasciotomy Wound VAC has been removed Pain still present in the right thigh No dizziness, low blood pressure  REVIEW OF SYSTEMS:    ROS  CONSTITUTIONAL: No documented fever. No fatigue, weakness. No weight gain, no weight loss.  EYES: No blurry or double vision.  ENT: No tinnitus. No postnasal drip. No redness of the oropharynx.  RESPIRATORY: No cough, no wheeze, no hemoptysis. No dyspnea.  CARDIOVASCULAR: No chest pain. No orthopnea. No palpitations. No syncope.  GASTROINTESTINAL: No nausea, no vomiting or diarrhea. No abdominal pain. No melena or hematochezia.  GENITOURINARY: No dysuria or hematuria.  ENDOCRINE: No polyuria or nocturia. No heat or cold intolerance.  HEMATOLOGY: No anemia. No bruising. No bleeding.  INTEGUMENTARY: No rashes. No lesions.  MUSCULOSKELETAL: No arthritis. No swelling. No gout.  Tenderness right thigh  NEUROLOGIC: No numbness, tingling, or ataxia. No seizure-type activity.  PSYCHIATRIC: No anxiety. No insomnia. No ADD.   DRUG ALLERGIES:  No Known Allergies  VITALS:  Blood pressure 113/67, pulse 64, temperature 98.3 F (36.8 C), temperature source Oral, resp. rate 16, height 5\' 7"  (1.702 m), weight 95.1 kg, last menstrual period 11/02/2018, SpO2 98 %.  PHYSICAL EXAMINATION:   Physical Exam  GENERAL:  28 y.o.-year-old patient lying in the bed with no acute distress.  EYES: Pupils equal, round, reactive to light and accommodation. No scleral icterus. Extraocular muscles intact.  HEENT: Head atraumatic, normocephalic. Oropharynx and nasopharynx clear.  NECK:  Supple, no jugular venous distention. No thyroid enlargement, no tenderness.  LUNGS: Normal breath  sounds bilaterally, no wheezing, rales, rhonchi. No use of accessory muscles of respiration.  CARDIOVASCULAR: S1, S2 normal. No murmurs, rubs, or gallops.  ABDOMEN: Soft, nontender, nondistended. Bowel sounds present. No organomegaly or mass.  EXTREMITIES: No cyanosis, clubbing or edema b/l.    Swelling and tenderness in the right thigh betrer Right thigh bandage Drain removed Pedal pulses present NEUROLOGIC: Cranial nerves II through XII are intact. No focal Motor or sensory deficits b/l.   PSYCHIATRIC: The patient is alert and oriented x 3.  SKIN: Right thigh bandage noted.  LABORATORY PANEL:   CBC Recent Labs  Lab 11/05/18 0418  WBC 5.7  HGB 8.9*  HCT 27.2*  PLT 219   ------------------------------------------------------------------------------------------------------------------ Chemistries  Recent Labs  Lab 11/04/18 0553  NA 141  K 3.8  CL 112*  CO2 24  GLUCOSE 104*  BUN 13  CREATININE 0.88  CALCIUM 7.6*   ------------------------------------------------------------------------------------------------------------------  Cardiac Enzymes No results for input(s): TROPONINI in the last 168 hours. ------------------------------------------------------------------------------------------------------------------  RADIOLOGY:  No results found.   ASSESSMENT AND PLAN:   28 year old female patient with no significant past medical history currently under hospitalist service for hematoma of the thigh  -Right thigh hematoma/compartment syndrome Neurovascular intact Revisit water and fascia to make closure Quadriceps muscle viable Neurovascular intact Status post orthopedic surgery follow-up Physical therapy and Occupational Therapy from today  -Dizziness and hypotension improved Started on IV fluids Blood pressure improved  -Acute hypokalemia resolved  -Right thigh pain secondary to hematoma from trauma IV dilaudid as needed for pain and prn oral  narcotics  -DVT prophylaxis sequential compression device to lower extremities  -COVID-19 test negative  -  Constipation Continue oral Miralax  All the records are reviewed and case discussed with Care Management/Social Worker. Management plans discussed with the patient, family and they are in agreement.  CODE STATUS: Full code  DVT Prophylaxis: SCDs  TOTAL TIME TAKING CARE OF THIS PATIENT: 33 minutes.   POSSIBLE D/C IN 2 to 3 DAYS, DEPENDING ON CLINICAL CONDITION.  Ihor AustinPavan  M.D on 11/05/2018 at 12:59 PM  Between 7am to 6pm - Pager - [phone]  After 6pm go to [email or web address] - password [email or web address] [facility]  [facility] [place] [facility]  Office  [phone]  CC: Primary care physician; [facility]  Note: This dictation was prepared with Dragon dictation along with smaller phrase technology. Any transcriptional errors that result from this process are unintentional.

## 2018-11-05 NOTE — Transfer of Care (Signed)
Immediate Anesthesia Transfer of Care Note  Patient: Monique Pacheco  Procedure(s) Performed: Right fasciotomy closure (Right Thigh)  Patient Location: PACU  Anesthesia Type:General  Level of Consciousness: sedated  Airway & Oxygen Therapy: Patient Spontanous Breathing and Patient connected to face mask oxygen  Post-op Assessment: Report given to RN and Post -op Vital signs reviewed and stable  Post vital signs: Reviewed and stable  Last Vitals:  Vitals Value Taken Time  BP 107/59 11/05/18 1042  Temp 36.8 C 11/05/18 1041  Pulse 64 11/05/18 1042  Resp 7 11/05/18 1042  SpO2 99 % 11/05/18 1042  Vitals shown include unvalidated device data.  Last Pain:  Vitals:   11/05/18 1041  TempSrc: Temporal  PainSc:       Patients Stated Pain Goal: 4 (12/87/86 7672)  Complications: No apparent anesthesia complications

## 2018-11-06 LAB — CBC
HCT: 27.6 % — ABNORMAL LOW (ref 36.0–46.0)
Hemoglobin: 8.9 g/dL — ABNORMAL LOW (ref 12.0–15.0)
MCH: 28.4 pg (ref 26.0–34.0)
MCHC: 32.2 g/dL (ref 30.0–36.0)
MCV: 88.2 fL (ref 80.0–100.0)
Platelets: 262 10*3/uL (ref 150–400)
RBC: 3.13 MIL/uL — ABNORMAL LOW (ref 3.87–5.11)
RDW: 12.3 % (ref 11.5–15.5)
WBC: 12.6 10*3/uL — ABNORMAL HIGH (ref 4.0–10.5)
nRBC: 0 % (ref 0.0–0.2)

## 2018-11-06 MED ORDER — OXYCODONE-ACETAMINOPHEN 7.5-325 MG PO TABS: 1.0000 | ORAL_TABLET | ORAL | 0 refills | Status: DC | PRN

## 2018-11-06 MED ORDER — METHOCARBAMOL 500 MG PO TABS: 500.0000 mg | ORAL_TABLET | Freq: Four times a day (QID) | ORAL | 0 refills | Status: DC | PRN

## 2018-11-06 MED ORDER — AMOXICILLIN-POT CLAVULANATE 875-125 MG PO TABS: 1.0000 | ORAL_TABLET | Freq: Two times a day (BID) | ORAL | 0 refills | Status: AC

## 2018-11-06 NOTE — TOC Transition Note (Signed)
Transition of Care [facility]) - CM/SW Discharge Note   Patient Details  Name: Monique Pacheco MRN: [id number] Date of Birth: [birth year]  Transition of [facility] CM/SW Contact:  O[place]de Hallenbeck, Lenice Llamas Phone Number: [phone]  11/06/2018, 12:03 PM   Clinical Narrative: Charlynne Cousins DME agency representative has delivered rolling walker and bedside commode to patient's room. Clinical Education officer, museum (CSW) met with patient alone at bedside to discuss D/C plan. CSW made patient aware that the [place] has not called the case manager back to arrange home health. CSW made patient aware that charity home health can be arranged with [facility]. CSW explained to patient that [facility] will contact her and do a financial assessment to determine if she is eligible for charity home health. Patient is agreeable to charity care with [facility] home health. CSW made Helene Kelp [facility] representative aware that patient will D/C today. Please reconsult if future social work needs arise. CSW signing off.     Final next level of care: Home w Home Health Services Barriers to Discharge: Barriers Resolved   Patient Goals and CMS Choice Patient states their goals for this hospitalization and ongoing recovery are:: Pain control   Choice offered to / list presented to : Patient  Discharge Placement                       Discharge Plan and Services   Discharge Planning Services: CM Consult Post Acute Care Choice: Home Health          DME Arranged: Bedside commode, Walker rolling DME Agency: [employer] Date DME Agency Contacted: 11/04/18 Time DME Agency Contacted: 1330 Representative spoke with at DME Agency: [place]: PT [place]: [facility] (formerly [employer]) Date [place]: 11/06/18 Time [place]: 3967 Representative spoke with at [place]: [place] ([place]) Interventions     Readmission Risk Interventions No flowsheet data found.
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
## 2018-11-06 NOTE — Progress Notes (Signed)
  Subjective:  POD #1 s/p left thigh fasciotomy closure.   Patient reports right thigh pain as moderate.  Patient feels that she is ready to go home.  She is up out of bed to a chair.  She is made good progress with physical therapy.  The physical therapist have recommended a hinged knee brace allowing for 30 degrees of flexion instead of the knee immobilizer to help the patient ambulate more easily with a walker.  Objective:   VITALS:   Vitals:   11/05/18 1934 11/05/18 2252 11/06/18 0437 11/06/18 0743  BP: 117/62 114/73 108/64 112/61  Pulse: 76 74 71 60  Resp: 18 18 19 17   Temp: 98.5 F (36.9 C) 98.6 F (37 C)  98.5 F (36.9 C)  TempSrc: Oral Oral  Oral  SpO2: 98% 100% 99% 100%  Weight:   89.7 kg   Height:        PHYSICAL EXAM: Right lower extremity: Neurovascular intact Sensation intact distally Intact pulses distally Dorsiflexion/Plantar flexion intact Incision: dressing C/D/I No cellulitis present Compartment soft  LABS  Results for orders placed or performed during the hospital encounter of 11/02/18 (from the past 24 hour(s))  CBC     Status: Abnormal   Collection Time: 11/06/18  6:02 AM  Result Value Ref Range   WBC 12.6 (H) 4.0 - 10.5 K/uL   RBC 3.13 (L) 3.87 - 5.11 MIL/uL   Hemoglobin 8.9 (L) 12.0 - 15.0 g/dL   HCT 27.6 (L) 36.0 - 46.0 %   MCV 88.2 80.0 - 100.0 fL   MCH 28.4 26.0 - 34.0 pg   MCHC 32.2 30.0 - 36.0 g/dL   RDW 12.3 11.5 - 15.5 %   Platelets 262 150 - 400 K/uL   nRBC 0.0 0.0 - 0.2 %    No results found.  Assessment/Plan: 1 Day Post-Op   Active Problems:   Intractable pain  Patient doing well following fasciotomy closure.  Patient was fitted for hinged knee brace today.  Patient may weight-bear as tolerated but should avoid deep knee flexion to prevent increased compartmental pressure in her thigh.  Patient was encouraged to continue to elevate her right lower extremity at home and apply ice as needed to reduce swelling.  She will  follow-up with me in the office a week from Monday, June 29.  Patient should take enteric coated aspirin 325mg  PO BID until follow up to prevent DVT.  I personally told the patient to take the aspirin.    Thornton Park , MD 11/06/2018, 1:36 PM
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
## 2018-11-06 NOTE — Progress Notes (Signed)
Pt was given Bisacodyl suppository after complaining of constipation. Pt had 2 episodes of bowel movements that were hard. Pt requested and was given prune juice.

## 2018-11-06 NOTE — Progress Notes (Signed)
Patient is being discharged to home today. DC & Rx instructions given and patient acknowledged understanding. IV removed, belongings packed.  Patient called husband to pick her up.
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
## 2019-02-10 ENCOUNTER — Other Ambulatory Visit: Payer: Self-pay | Admitting: *Deleted

## 2019-02-10 DIAGNOSIS — Z20822 Contact with and (suspected) exposure to covid-19: Secondary | ICD-10-CM

## 2019-02-12 LAB — NOVEL CORONAVIRUS, NAA: SARS-CoV-2, NAA: NOT DETECTED

## 2019-03-29 ENCOUNTER — Other Ambulatory Visit: Payer: Self-pay

## 2019-03-29 DIAGNOSIS — Z20822 Contact with and (suspected) exposure to covid-19: Secondary | ICD-10-CM

## 2019-03-30 LAB — NOVEL CORONAVIRUS, NAA: SARS-CoV-2, NAA: NOT DETECTED

## 2019-04-19 ENCOUNTER — Other Ambulatory Visit: Payer: Self-pay | Admitting: Orthopedic Surgery

## 2019-04-22 ENCOUNTER — Encounter: Payer: Self-pay | Admitting: *Deleted

## 2019-04-22 ENCOUNTER — Other Ambulatory Visit: Payer: Self-pay

## 2019-04-22 ENCOUNTER — Encounter
Admission: RE | Admit: 2019-04-22 | Discharge: 2019-04-22 | Disposition: A | Discharge status: Home / Self Care | Payer: No Typology Code available for payment source | Source: Ambulatory Visit | Attending: Orthopedic Surgery | Admitting: Orthopedic Surgery

## 2019-04-22 NOTE — Patient Instructions (Signed)
Your procedure is scheduled on Thurs 12/10 Report to Day Surgery. To find out your arrival time please call [phone] between 1PM - 3PM on Wed. 12/9.  Remember: Instructions that are not followed completely may result in serious medical risk,  up to and including death, or upon the discretion of your surgeon and anesthesiologist your  surgery may need to be rescheduled.     _X__ 1. Do not eat food after midnight the night before your procedure.                 No gum chewing or hard candies. You may drink clear liquids up to 2 hours                 before you are scheduled to arrive for your surgery- DO not drink clear                 liquids within 2 hours of the start of your surgery.                 Clear Liquids include:  water, apple juice without pulp, clear carbohydrate                 drink such as Clearfast of Gatorade, Black Coffee or Tea (Do not add                 anything to coffee or tea).  __X__2.  On the morning of surgery brush your teeth with toothpaste and water, you                may rinse your mouth with mouthwash if you wish.  Do not swallow any toothpaste of mouthwash.     _X__ 3.  No Alcohol for 24 hours before or after surgery.   ___ 4.  Do Not Smoke or use e-cigarettes For 24 Hours Prior to Your Surgery.                 Do not use any chewable tobacco products for at least 6 hours prior to                 surgery.  ____  5.  Bring all medications with you on the day of surgery if instructed.   __x__  6.  Notify your doctor if there is any change in your medical condition      (cold, fever, infections).     Do not wear jewelry, make-up, hairpins, clips or nail polish. Do not wear lotions, powders, or perfumes. You may wear deodorant. Do not shave 48 hours prior to surgery. Men may shave face and neck. Do not bring valuables to the hospital.    [facility] is not responsible for any belongings or valuables.  Contacts, dentures  or bridgework may not be worn into surgery. Leave your suitcase in the car. After surgery it may be brought to your room. For patients admitted to the hospital, discharge time is determined by your treatment team.   Patients discharged the day of surgery will not be allowed to drive home.   Please read over the following fact sheets that you were given:     _x___ Take these medicines the morning of surgery with A SIP OF WATER:    1. omeprazole (PRILOSEC) 20 MG capsulenight before and morning of surgery  2.   3.   4.  5.  6.  ____ Fleet Enema (as directed)   ___x_ Use CHG Soap  as directed  ____ Use inhalers on the day of surgery  ____ Stop metformin 2 days prior to surgery    ____ Take 1/2 of usual insulin dose the night before surgery. No insulin the morning          of surgery.   ____ Stop Coumadin/Plavix/aspirin on   __x__ Stop Anti-inflammatories aspirin-acetaminophen-caffeine (EXCEDRIN MIGRAINE) 250-250-65 MG tablet  No ibuprofen or aleve  May take tylenol   __x__ Stop all supplements until after surgery.  Omega-3 Fatty Acids (FISH OIL) 1000 MG CAPS  and OTC suppplements  ____ Bring C-Pap to the hospital.

## 2019-04-26 ENCOUNTER — Other Ambulatory Visit: Payer: Self-pay

## 2019-04-26 ENCOUNTER — Other Ambulatory Visit
Admission: RE | Admit: 2019-04-26 | Discharge: 2019-04-26 | Disposition: A | Discharge status: Home / Self Care | Payer: No Typology Code available for payment source | Source: Ambulatory Visit | Attending: Orthopedic Surgery | Admitting: Orthopedic Surgery

## 2019-04-26 DIAGNOSIS — Z20828 Contact with and (suspected) exposure to other viral communicable diseases: Secondary | ICD-10-CM | POA: Diagnosis not present

## 2019-04-26 DIAGNOSIS — Z01812 Encounter for preprocedural laboratory examination: Secondary | ICD-10-CM | POA: Diagnosis present

## 2019-04-26 LAB — BASIC METABOLIC PANEL
Anion gap: 8 (ref 5–15)
BUN: 12 mg/dL (ref 6–20)
CO2: 25 mmol/L (ref 22–32)
Calcium: 9.1 mg/dL (ref 8.9–10.3)
Chloride: 104 mmol/L (ref 98–111)
Creatinine, Ser: 0.73 mg/dL (ref 0.44–1.00)
GFR calc Af Amer: >60 mL/min (ref >60)
GFR calc non Af Amer: >60 mL/min (ref >60)
Glucose, Bld: 105 mg/dL — ABNORMAL HIGH (ref 70–99)
Potassium: 4.1 mmol/L (ref 3.5–5.1)
Sodium: 137 mmol/L (ref 135–145)

## 2019-04-26 LAB — CBC WITH DIFFERENTIAL/PLATELET
Abs Immature Granulocytes: 0.02 10*3/uL (ref 0.00–0.07)
Basophils Absolute: 0.1 10*3/uL (ref 0.0–0.1)
Basophils Relative: 1 %
Eosinophils Absolute: 0.1 10*3/uL (ref 0.0–0.5)
Eosinophils Relative: 3 %
HCT: 38.4 % (ref 36.0–46.0)
Hemoglobin: 12.9 g/dL (ref 12.0–15.0)
Immature Granulocytes: 1 %
Lymphocytes Relative: 32 %
Lymphs Abs: 1.4 10*3/uL (ref 0.7–4.0)
MCH: 28.6 pg (ref 26.0–34.0)
MCHC: 33.6 g/dL (ref 30.0–36.0)
MCV: 85.1 fL (ref 80.0–100.0)
Monocytes Absolute: 0.3 10*3/uL (ref 0.1–1.0)
Monocytes Relative: 7 %
Neutro Abs: 2.4 10*3/uL (ref 1.7–7.7)
Neutrophils Relative %: 56 %
Platelets: 275 10*3/uL (ref 150–400)
RBC: 4.51 MIL/uL (ref 3.87–5.11)
RDW: 12.7 % (ref 11.5–15.5)
WBC: 4.3 10*3/uL (ref 4.0–10.5)
nRBC: 0 % (ref 0.0–0.2)

## 2019-04-26 LAB — PROTIME-INR
INR: 1 (ref 0.8–1.2)
Prothrombin Time: 12.9 seconds (ref 11.4–15.2)

## 2019-04-26 LAB — SARS CORONAVIRUS 2 (TAT 6-24 HRS): SARS Coronavirus 2: NEGATIVE

## 2019-04-26 LAB — APTT: aPTT: 28 seconds (ref 24–36)

## 2019-04-29 ENCOUNTER — Encounter: Payer: Self-pay | Admitting: Orthopedic Surgery

## 2019-04-29 ENCOUNTER — Other Ambulatory Visit: Payer: Self-pay

## 2019-04-29 ENCOUNTER — Ambulatory Visit: Payer: No Typology Code available for payment source | Admitting: Certified Registered Nurse Anesthetist

## 2019-04-29 ENCOUNTER — Ambulatory Visit
Admission: RE | Admit: 2019-04-29 | Discharge: 2019-04-29 | Disposition: A | Discharge status: Home / Self Care | Payer: No Typology Code available for payment source | Attending: Orthopedic Surgery | Admitting: Orthopedic Surgery

## 2019-04-29 ENCOUNTER — Encounter
Admission: RE | Disposition: A | Discharge status: Home / Self Care | Payer: Self-pay | Source: Home / Self Care | Attending: Orthopedic Surgery

## 2019-04-29 DIAGNOSIS — K4190 Unilateral femoral hernia, without obstruction or gangrene, not specified as recurrent: Secondary | ICD-10-CM | POA: Diagnosis not present

## 2019-04-29 DIAGNOSIS — M6289 Other specified disorders of muscle: Secondary | ICD-10-CM | POA: Insufficient documentation

## 2019-04-29 DIAGNOSIS — K219 Gastro-esophageal reflux disease without esophagitis: Secondary | ICD-10-CM | POA: Diagnosis not present

## 2019-04-29 HISTORY — DX: Family history of other specified conditions: Z84.89

## 2019-04-29 HISTORY — PX: SECONDARY CLOSURE OF WOUND: SHX6208

## 2019-04-29 HISTORY — DX: Gastro-esophageal reflux disease without esophagitis: K21.9

## 2019-04-29 LAB — POCT PREGNANCY, URINE
Preg Test, Ur: NEGATIVE
Preg Test, Ur: NEGATIVE

## 2019-04-29 SURGERY — SECONDARY CLOSURE OF WOUND
Anesthesia: General | Site: Thigh | Laterality: Right

## 2019-04-29 MED ORDER — LACTATED RINGERS IV BOLUS
500.0000 mL | Freq: Once | INTRAVENOUS | Status: AC
  Administered 2019-04-29: 500 mL via INTRAVENOUS

## 2019-04-29 MED ORDER — ACETAMINOPHEN 10 MG/ML IV SOLN
INTRAVENOUS | Status: DC | PRN
  Administered 2019-04-29: 1000 mg via INTRAVENOUS

## 2019-04-29 MED ORDER — CEFAZOLIN SODIUM-DEXTROSE 2-4 GM/100ML-% IV SOLN
2.0000 g | INTRAVENOUS | Status: AC
  Administered 2019-04-29: 2 g via INTRAVENOUS

## 2019-04-29 MED ORDER — FENTANYL CITRATE (PF) 100 MCG/2ML IJ SOLN
INTRAMUSCULAR | Status: AC
  Administered 2019-04-29: 25 ug via INTRAVENOUS
  Filled 2019-04-29: qty 2

## 2019-04-29 MED ORDER — ONDANSETRON HCL 4 MG PO TABS: 4.0000 mg | ORAL_TABLET | Freq: Three times a day (TID) | ORAL | 0 refills | Status: DC | PRN

## 2019-04-29 MED ORDER — NEOMYCIN-POLYMYXIN B GU 40-200000 IR SOLN
Status: DC | PRN
  Administered 2019-04-29: 4 mL

## 2019-04-29 MED ORDER — FENTANYL CITRATE (PF) 100 MCG/2ML IJ SOLN
INTRAMUSCULAR | Status: AC
  Administered 2019-04-29: 50 ug via INTRAVENOUS
  Filled 2019-04-29: qty 2

## 2019-04-29 MED ORDER — ACETAMINOPHEN 10 MG/ML IV SOLN
INTRAVENOUS | Status: AC
  Filled 2019-04-29: qty 100

## 2019-04-29 MED ORDER — DEXMEDETOMIDINE HCL 200 MCG/2ML IV SOLN
INTRAVENOUS | Status: DC | PRN
  Administered 2019-04-29 (×2): 8 ug via INTRAVENOUS

## 2019-04-29 MED ORDER — FENTANYL CITRATE (PF) 100 MCG/2ML IJ SOLN
25.0000 ug | INTRAMUSCULAR | Status: AC | PRN
  Administered 2019-04-29 (×3): 25 ug via INTRAVENOUS

## 2019-04-29 MED ORDER — SUCCINYLCHOLINE CHLORIDE 20 MG/ML IJ SOLN
INTRAMUSCULAR | Status: AC
  Filled 2019-04-29: qty 1

## 2019-04-29 MED ORDER — ROCURONIUM BROMIDE 50 MG/5ML IV SOLN
INTRAVENOUS | Status: AC
  Filled 2019-04-29: qty 1

## 2019-04-29 MED ORDER — FENTANYL CITRATE (PF) 100 MCG/2ML IJ SOLN
INTRAMUSCULAR | Status: DC | PRN
  Administered 2019-04-29 (×4): 25 ug via INTRAVENOUS

## 2019-04-29 MED ORDER — MIDAZOLAM HCL 2 MG/2ML IJ SOLN
INTRAMUSCULAR | Status: DC | PRN
  Administered 2019-04-29: 2 mg via INTRAVENOUS

## 2019-04-29 MED ORDER — LIDOCAINE HCL (CARDIAC) PF 100 MG/5ML IV SOSY
PREFILLED_SYRINGE | INTRAVENOUS | Status: DC | PRN
  Administered 2019-04-29: 80 mg via INTRAVENOUS

## 2019-04-29 MED ORDER — CEFAZOLIN SODIUM-DEXTROSE 2-4 GM/100ML-% IV SOLN
INTRAVENOUS | Status: AC
  Filled 2019-04-29: qty 100

## 2019-04-29 MED ORDER — PROPOFOL 10 MG/ML IV BOLUS
INTRAVENOUS | Status: DC | PRN
  Administered 2019-04-29: 200 mg via INTRAVENOUS

## 2019-04-29 MED ORDER — OXYCODONE HCL 5 MG PO TABS: 5.0000 mg | ORAL_TABLET | ORAL | 0 refills | Status: DC | PRN

## 2019-04-29 MED ORDER — CHLORHEXIDINE GLUCONATE CLOTH 2 % EX PADS: 6.0000 | MEDICATED_PAD | Freq: Once | CUTANEOUS | Status: DC

## 2019-04-29 MED ORDER — DEXMEDETOMIDINE HCL IN NACL 80 MCG/20ML IV SOLN
INTRAVENOUS | Status: AC
  Filled 2019-04-29: qty 20

## 2019-04-29 MED ORDER — OXYCODONE HCL 5 MG/5ML PO SOLN: 5.0000 mg | Freq: Once | ORAL | Status: AC | PRN

## 2019-04-29 MED ORDER — LIDOCAINE HCL (PF) 2 % IJ SOLN
INTRAMUSCULAR | Status: AC
  Filled 2019-04-29: qty 10

## 2019-04-29 MED ORDER — ASPIRIN EC 325 MG PO TBEC: 325.0000 mg | DELAYED_RELEASE_TABLET | Freq: Every day | ORAL | 0 refills | Status: DC

## 2019-04-29 MED ORDER — FENTANYL CITRATE (PF) 100 MCG/2ML IJ SOLN
INTRAMUSCULAR | Status: AC
  Filled 2019-04-29: qty 2

## 2019-04-29 MED ORDER — OXYCODONE HCL 5 MG PO TABS: 5.0000 mg | ORAL_TABLET | Freq: Once | ORAL | Status: AC | PRN

## 2019-04-29 MED ORDER — MIDAZOLAM HCL 2 MG/2ML IJ SOLN
INTRAMUSCULAR | Status: AC
  Filled 2019-04-29: qty 2

## 2019-04-29 MED ORDER — BUPIVACAINE HCL 0.5 % IJ SOLN
INTRAMUSCULAR | Status: DC | PRN
  Administered 2019-04-29: 30 mL

## 2019-04-29 MED ORDER — OXYCODONE HCL 5 MG PO TABS
ORAL_TABLET | ORAL | Status: AC
  Administered 2019-04-29: 5 mg via ORAL
  Filled 2019-04-29: qty 1

## 2019-04-29 MED ORDER — PHENYLEPHRINE HCL (PRESSORS) 10 MG/ML IV SOLN
INTRAVENOUS | Status: DC | PRN
  Administered 2019-04-29 (×2): 200 ug via INTRAVENOUS
  Administered 2019-04-29 (×3): 100 ug via INTRAVENOUS
  Administered 2019-04-29: 200 ug via INTRAVENOUS
  Administered 2019-04-29: 50 ug via INTRAVENOUS

## 2019-04-29 MED ORDER — ONDANSETRON HCL 4 MG/2ML IJ SOLN
INTRAMUSCULAR | Status: DC | PRN
  Administered 2019-04-29: 4 mg via INTRAVENOUS

## 2019-04-29 MED ORDER — PROPOFOL 10 MG/ML IV BOLUS
INTRAVENOUS | Status: AC
  Filled 2019-04-29: qty 20

## 2019-04-29 MED ORDER — DEXAMETHASONE SODIUM PHOSPHATE 10 MG/ML IJ SOLN
INTRAMUSCULAR | Status: DC | PRN
  Administered 2019-04-29: 10 mg via INTRAVENOUS

## 2019-04-29 MED ORDER — ONDANSETRON HCL 4 MG/2ML IJ SOLN
INTRAMUSCULAR | Status: AC
  Filled 2019-04-29: qty 2

## 2019-04-29 MED ORDER — LACTATED RINGERS IV SOLN
INTRAVENOUS | Status: DC
  Administered 2019-04-29: 08:00:00 via INTRAVENOUS

## 2019-04-29 MED ORDER — DEXAMETHASONE SODIUM PHOSPHATE 10 MG/ML IJ SOLN
INTRAMUSCULAR | Status: AC
  Filled 2019-04-29: qty 1

## 2019-04-29 SURGICAL SUPPLY — 48 items
BNDG COHESIVE 6X5 TAN STRL LF (GAUZE/BANDAGES/DRESSINGS) ×3 IMPLANT
BNDG ELASTIC 6X5.8 VLCR NS LF (GAUZE/BANDAGES/DRESSINGS) ×3 IMPLANT
CANISTER SUCT 1200ML W/VALVE (MISCELLANEOUS) ×3 IMPLANT
COVER WAND RF STERILE (DRAPES) ×3 IMPLANT
CUFF TOURN SGL QUICK 18X4 (TOURNIQUET CUFF) IMPLANT
CUFF TOURN SGL QUICK 24 (TOURNIQUET CUFF)
CUFF TRNQT CYL 24X4X16.5-23 (TOURNIQUET CUFF) IMPLANT
DRAPE 3/4 80X56 (DRAPES) ×3 IMPLANT
DRAPE INCISE IOBAN 66X60 STRL (DRAPES) ×3 IMPLANT
DRAPE SURG 17X11 SM STRL (DRAPES) ×3 IMPLANT
DRAPE U-SHAPE 47X51 STRL (DRAPES) ×3 IMPLANT
DRSG OPSITE POSTOP 4X10 (GAUZE/BANDAGES/DRESSINGS) ×3 IMPLANT
DURAPREP 26ML APPLICATOR (WOUND CARE) ×6 IMPLANT
ELECT CAUTERY BLADE 6.4 (BLADE) ×3 IMPLANT
ELECT REM PT RETURN 9FT ADLT (ELECTROSURGICAL) ×3
ELECTRODE REM PT RTRN 9FT ADLT (ELECTROSURGICAL) ×1 IMPLANT
GAUZE SPONGE 4X4 12PLY STRL (GAUZE/BANDAGES/DRESSINGS) ×3 IMPLANT
GAUZE XEROFORM 1X8 LF (GAUZE/BANDAGES/DRESSINGS) ×3 IMPLANT
GLOVE BIOGEL PI IND STRL 9 (GLOVE) ×1 IMPLANT
GLOVE BIOGEL PI INDICATOR 9 (GLOVE) ×2
GLOVE SURG 9.0 ORTHO LTXF (GLOVE) ×9 IMPLANT
GOWN STRL REUS TWL 2XL XL LVL4 (GOWN DISPOSABLE) ×3 IMPLANT
GOWN STRL REUS W/ TWL LRG LVL3 (GOWN DISPOSABLE) ×1 IMPLANT
GOWN STRL REUS W/TWL LRG LVL3 (GOWN DISPOSABLE) ×2
HEMOVAC 400ML (MISCELLANEOUS)
KIT DRAIN HEMOVAC JP 7FR 400ML (MISCELLANEOUS) IMPLANT
KIT TURNOVER KIT A (KITS) ×3 IMPLANT
NDL SAFETY ECLIPSE 18X1.5 (NEEDLE) ×1 IMPLANT
NEEDLE FILTER BLUNT 18X 1/2SAF (NEEDLE) ×2
NEEDLE FILTER BLUNT 18X1 1/2 (NEEDLE) ×1 IMPLANT
NEEDLE HYPO 18GX1.5 SHARP (NEEDLE) ×2
NS IRRIG 500ML POUR BTL (IV SOLUTION) ×3 IMPLANT
PACK EXTREMITY ARMC (MISCELLANEOUS) ×3 IMPLANT
PAD ABD DERMACEA PRESS 5X9 (GAUZE/BANDAGES/DRESSINGS) ×6 IMPLANT
PADDING CAST BLEND 6X4 STRL (MISCELLANEOUS) ×1 IMPLANT
PADDING STRL CAST 6IN (MISCELLANEOUS) ×2
SPONGE LAP 18X18 RF (DISPOSABLE) ×3 IMPLANT
STAPLER SKIN PROX 35W (STAPLE) ×3 IMPLANT
STOCKINETTE IMPERV 14X48 (MISCELLANEOUS) ×3 IMPLANT
SUT ETHIBOND #5 BRAIDED 30INL (SUTURE) ×3 IMPLANT
SUT TICRON 2-0 30IN 311381 (SUTURE) ×6 IMPLANT
SUT VIC AB 0 CT1 27 (SUTURE) ×2
SUT VIC AB 0 CT1 27XCR 8 STRN (SUTURE) ×1 IMPLANT
SUT VIC AB 0 CT2 27 (SUTURE) ×6 IMPLANT
SUT VIC AB 1 CTX 27 (SUTURE) ×3 IMPLANT
SUT VIC AB 2-0 CT2 27 (SUTURE) ×6 IMPLANT
SYR 10ML LL (SYRINGE) ×3 IMPLANT
TAPE MICROFOAM 4IN (TAPE) ×3 IMPLANT

## 2019-04-29 NOTE — Discharge Instructions (Signed)
Oxycodone 5mg  taken 04/29/2019 at 10:27am.     AMBULATORY SURGERY  DISCHARGE INSTRUCTIONS   1) The drugs that you were given will stay in your system until tomorrow so for the next 24 hours you should not:  A) Drive an automobile B) Make any legal decisions C) Drink any alcoholic beverage   2) You may resume regular meals tomorrow.  Today it is better to start with liquids and gradually work up to solid foods.  You may eat anything you prefer, but it is better to start with liquids, then soup and crackers, and gradually work up to solid foods.   3) Please notify your doctor immediately if you have any unusual bleeding, trouble breathing, redness and pain at the surgery site, drainage, fever, or pain not relieved by medication.    4) Additional Instructions:        Please contact your physician with any problems or Same Day Surgery at [phone], Monday through Friday 6 am to 4 pm, or [place] [facility] number at [phone].

## 2019-04-29 NOTE — Transfer of Care (Signed)
Immediate Anesthesia Transfer of Care Note  Patient: Monique Pacheco  Procedure(s) Performed: RIGHT THIGH REPAIR OF FASCIA (Right Thigh)  Patient Location: PACU  Anesthesia Type:General  Level of Consciousness: awake, drowsy and patient cooperative  Airway & Oxygen Therapy: Patient Spontanous Breathing and Patient connected to face mask oxygen  Post-op Assessment: Report given to RN and Post -op Vital signs reviewed and stable  Post vital signs: Reviewed and stable  Last Vitals:  Vitals Value Taken Time  BP 103/72 04/29/19 0918  Temp    Pulse 78 04/29/19 0920  Resp 12 04/29/19 0920  SpO2 100 % 04/29/19 0920  Vitals shown include unvalidated device data.  Last Pain:  Vitals:   04/29/19 0609  TempSrc: Tympanic  PainSc: 6          Complications: No apparent anesthesia complications

## 2019-04-29 NOTE — Op Note (Signed)
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 female with a diagnosis of fascial defect of the right thigh with muscle herniation who has had persistent lateral distal thigh pain failed conservative measures and elected for surgical management.    I discussed the risks and benefits of surgery. The risks include but are not limited to infection, bleeding, nerve or blood vessel injury, joint stiffness or loss of motion, persistent pain, weakness or instability and hardware failure and the need for further surgery. Medical risks include but are not limited to DVT and pulmonary embolism, myocardial infarction, stroke, pneumonia, respiratory failure and death. Patient understood these risks and wished to proceed.   OPERATIVE FINDINGS: Right thigh muscle herniation through deep fascial defect measuring approximately 20 to 25 mm.  OPERATIVE PROCEDURE: Patient was met in the preoperative area.  Her right thigh was marked according to the hospital's correct site of surgery protocol.  Patient localized the area of pain for me and this was also marked on her skin with a surgical marker.  Patient had a history and physical performed at the bedside.  I reviewed the details of the operation as well as the postoperative course with her and answered all her questions.  Patient was brought to the operating room where she was placed supine on the operative table.  She underwent general anesthesia.  Patient had a bump placed under her right hip.  She was prepped and draped in a sterile fashion.  Timeout was performed to verify the patient's name, date of birth, medical record number, correct correct site of surgery and correct  procedure to be performed.  The timeout was also used to verify the patient received antibiotics and that appropriate instruments, and radiographic studies were available in the room.  Once all in attendance were in agreement the case began.  Patient received 2 g of Ancef prior to the onset of the case.  A linear incision was made through the distal half of the patient's previous incision site.  All bleeding vessels were cauterized.  Full-thickness skin flaps were developed taking care not to violate the deep fascia.  Just posterior to the IT band the 20 to 25 mm fascial defect was found with muscle herniating through this defect.  This correlated with the location of the muscle herniation on MRI which was visualized in the operating room during surgery.  The fascial defect was closed using #2 Tycron in an interrupted fashion.  The muscle was reduced while the fascia was repaired.  The wound was then copiously irrigated.  The subcutaneous tissue was closed with 0 and 2-0 Vicryl.  The skin was approximated with staples.  A dry sterile compressive dressing was placed on the right thigh.  The patient was awoken and brought to the PACU in stable condition.  I were scrubbed and present for the entire case and all sharp, sponge and instrument counts were correct at the conclusion the case.

## 2019-04-29 NOTE — Anesthesia Post-op Follow-up Note (Signed)
Anesthesia QCDR form completed.        

## 2019-04-29 NOTE — Addendum Note (Signed)
Addendum  created 04/29/19 1105 by Lia Foyer, CRNA   Charge Capture section accepted

## 2019-04-29 NOTE — H&P (Signed)
PREOPERATIVE H&P  Chief Complaint: Right thigh pain with small fascial tear and muscle herniation  HPI: Monique Pacheco is a 28 y.o. female who presents for preoperative history and physical with a diagnosis of thigh pain status post fasciotomy closure on 11/02/2018.  Patient has developed recurrent right thigh pain in the distal third without new injury. Symptoms of thigh pain are significantly impairing activities of daily living and her ability to exercise.  MRI has confirmed a small fascial defect posterior to the initial fasciotomy closure in the distal lateral third of the patient's thigh.  I recommended surgical closure of the fascial defect.   She has agreed with surgical management.   Past Medical History:  Diagnosis Date  . Family history of adverse reaction to anesthesia    Mother bp bottoms out  . GERD (gastroesophageal reflux disease)    Past Surgical History:  Procedure Laterality Date  . FASCIOTOMY Right 11/02/2018   Procedure: FASCIOTOMY RIGHT THIGH;  Surgeon: Juanell Fairly, MD;  Location: [facility] ORS;  Service: Orthopedics;  Laterality: Right;  . INCISION AND DRAINAGE Right 11/05/2018   Procedure: Right fasciotomy closure;  Surgeon: Juanell Fairly, MD;  Location: [facility] ORS;  Service: Orthopedics;  Laterality: Right;  . TONSILLECTOMY     Social History   Socioeconomic History  . Marital status: Married    Spouse name: Not on file  . Number of children: Not on file  . Years of education: Not on file  . Highest education level: Not on file  Occupational History  . Not on file  Tobacco Use  . Smoking status: Never Smoker  . Smokeless tobacco: Never Used  Substance and Sexual Activity  . Alcohol use: Yes    Comment: occasionally  . Drug use: No  . Sexual activity: Not on file  Other Topics Concern  . Not on file  Social History Narrative  . Not on file   Social Determinants of Health   Financial Resource Strain:   . Difficulty of Paying Living Expenses:  Not on file  Food Insecurity:   . Worried About Programme researcher, broadcasting/film/video in the Last Year: Not on file  . Ran Out of Food in the Last Year: Not on file  Transportation Needs:   . Lack of Transportation (Medical): Not on file  . Lack of Transportation (Non-Medical): Not on file  Physical Activity:   . Days of Exercise per Week: Not on file  . Minutes of Exercise per Session: Not on file  Stress:   . Feeling of Stress : Not on file  Social Connections:   . Frequency of Communication with Friends and Family: Not on file  . Frequency of Social Gatherings with Friends and Family: Not on file  . Attends Religious Services: Not on file  . Active Member of Clubs or Organizations: Not on file  . Attends Banker Meetings: Not on file  . Marital Status: Not on file   History reviewed. No pertinent family history. No Known Allergies Prior to Admission medications   Medication Sig Start Date End Date Taking? Authorizing Provider  aspirin-acetaminophen-caffeine (EXCEDRIN MIGRAINE) [phone] MG tablet Take 2 tablets by mouth every 8 (eight) hours as needed for headache.   Yes [provider]  Multiple Vitamin (MULTIVITAMIN WITH MINERALS) TABS tablet Take 1 tablet by mouth 2 (two) times daily.   Yes [provider]  Omega-3 Fatty Acids (FISH OIL) 1000 MG CAPS Take 1,000 mg by mouth daily.   Yes [provider]  omeprazole (PRILOSEC) 20 MG capsule Take 20 mg by mouth daily as needed (acid reflux).   Yes [provider]  OVER THE COUNTER MEDICATION Take 1 capsule by mouth daily. Cellwise otc supplement   Yes [provider]  OVER THE COUNTER MEDICATION Take 1 tablet by mouth 2 (two) times daily. Recover AI otc supplement   Yes [provider]  Probiotic CAPS Take 1 capsule by mouth daily.   Yes [provider]     Positive ROS: All other systems have been reviewed and were otherwise negative with the exception of those mentioned  in the HPI and as above.  Physical Exam: General: Alert, no acute distress Cardiovascular: Regular rate and rhythm, no murmurs rubs or gallops.  No pedal edema Respiratory: Clear to auscultation bilaterally, no wheezes rales or rhonchi. No cyanosis, no use of accessory musculature GI: No organomegaly, abdomen is soft and non-tender nondistended with positive bowel sounds. Skin: Skin intact, no lesions within the operative field. Neurologic: Sensation intact distally Psychiatric: Patient is competent for consent with normal mood and affect Lymphatic: No cervical lymphadenopathy  MUSCULOSKELETAL: Right thigh: Patient's skin is intact.  Her previous incision is well-healed.  Patient has no swelling, erythema or ecchymosis.  Her thigh compartments are soft and compressible.  Patient has paresthesias posterior to her previous incision.  Distally she is neurovascular intact.  She has tenderness over the area of fascial defect in the posterior lateral third of her right thigh.  Assessment: Right thigh pain with small fascial defect in the distal lateral third of the thigh  Plan: Plan for Procedure(s): CLOSURE OF FASCIAL DEFECT   I believe the patient's pain may be coming from a fascial defect with muscle herniation.  I have recommended surgical closure of this fascial defect.  Patient understands that I will have to reopen a significant portion of her prior incision to access the area of fascial defect in the posterior lateral of the distal third of her right thigh.  I discussed the risks and benefits of surgery. The risks include but are not limited to infection, bleeding, nerve or blood vessel injury, joint stiffness or loss of motion, persistent pain, recurrent fascial tear and the need for further surgery. Medical risks include but are not limited to DVT and pulmonary embolism, myocardial infarction, stroke, pneumonia, respiratory failure and death. Patient understood these risks and wished to  proceed.    Thornton Park, MD   04/29/2019 7:40 AM

## 2019-04-29 NOTE — Anesthesia Postprocedure Evaluation (Signed)
Anesthesia Post Note  Patient: Monique Pacheco  Procedure(s) Performed: RIGHT THIGH REPAIR OF FASCIA (Right Thigh)  Patient location during evaluation: PACU Anesthesia Type: General Level of consciousness: awake and alert Pain management: pain level controlled Vital Signs Assessment: post-procedure vital signs reviewed and stable Respiratory status: spontaneous breathing, nonlabored ventilation, respiratory function stable and patient connected to nasal cannula oxygen Cardiovascular status: blood pressure returned to baseline and stable Postop Assessment: no apparent nausea or vomiting Anesthetic complications: no     Last Vitals:  Vitals:   04/29/19 1026 04/29/19 1033  BP: 104/72 109/72  Pulse: 74 67  Resp: 12 15  Temp:    SpO2: 99% 100%    Last Pain:  Vitals:   04/29/19 1026  TempSrc:   PainSc: 6                  [name]

## 2019-04-29 NOTE — Anesthesia Procedure Notes (Signed)
Procedure Name: LMA Insertion Date/Time: 04/29/2019 7:40 AM Performed by: Lia Foyer, CRNA Pre-anesthesia Checklist: Patient identified, Emergency Drugs available, Suction available and Patient being monitored Patient Re-evaluated:Patient Re-evaluated prior to induction Oxygen Delivery Method: Circle system utilized Preoxygenation: Pre-oxygenation with 100% oxygen Induction Type: IV induction Ventilation: Mask ventilation without difficulty LMA: LMA inserted LMA Size: 4.0 Number of attempts: 1 Placement Confirmation: positive ETCO2,  CO2 detector and breath sounds checked- equal and bilateral Dental Injury: Teeth and Oropharynx as per pre-operative assessment

## 2019-04-29 NOTE — Anesthesia Preprocedure Evaluation (Signed)
Anesthesia Evaluation  Patient identified by MRN, date of birth, ID band Patient awake    Reviewed: Allergy & Precautions, H&P , NPO status , Patient's Chart, lab work & pertinent test results  History of Anesthesia Complications (+) Family history of anesthesia reaction and history of anesthetic complications  Airway Mallampati: II  TM Distance: >3 FB Neck ROM: full    Dental  (+) Chipped   Pulmonary neg pulmonary ROS, neg shortness of breath,           Cardiovascular Exercise Tolerance: Good (-) angina(-) Past MI and (-) DOE negative cardio ROS       Neuro/Psych negative neurological ROS  negative psych ROS   GI/Hepatic Neg liver ROS, GERD  Medicated and Controlled,  Endo/Other  negative endocrine ROS  Renal/GU      Musculoskeletal   Abdominal   Peds  Hematology negative hematology ROS (+)   Anesthesia Other Findings Past Medical History: No date: Family history of adverse reaction to anesthesia     Comment:  Mother bp bottoms out No date: GERD (gastroesophageal reflux disease)  Past Surgical History: 11/02/2018: FASCIOTOMY; Right     Comment:  Procedure: FASCIOTOMY RIGHT THIGH;  Surgeon: Thornton Park, MD;  Location: [facility] ORS;  Service: Orthopedics;                Laterality: Right; 11/05/2018: INCISION AND DRAINAGE; Right     Comment:  Procedure: Right fasciotomy closure;  Surgeon:               Thornton Park, MD;  Location: [facility] ORS;  Service:               Orthopedics;  Laterality: Right; No date: TONSILLECTOMY  BMI    Body Mass Index: 29.76 kg/m      Reproductive/Obstetrics negative OB ROS                             Anesthesia Physical Anesthesia Plan  ASA: II  Anesthesia Plan: General LMA   Post-op Pain Management:    Induction: Intravenous  PONV Risk Score and Plan: Dexamethasone, Ondansetron, Midazolam and Treatment may vary due to age  or medical condition  Airway Management Planned: LMA  Additional Equipment:   Intra-op Plan:   Post-operative Plan: Extubation in OR  Informed Consent: I have reviewed the patients History and Physical, chart, labs and discussed the procedure including the risks, benefits and alternatives for the proposed anesthesia with the patient or authorized representative who has indicated his/her understanding and acceptance.     Dental Advisory Given  Plan Discussed with: Anesthesiologist, CRNA and Surgeon  Anesthesia Plan Comments: (Patient consented for risks of anesthesia including but not limited to:  - adverse reactions to medications - damage to teeth, lips or other oral mucosa - sore throat or hoarseness - Damage to heart, brain, lungs or loss of life  Patient voiced understanding.)        Anesthesia Quick Evaluation

## 2019-05-12 ENCOUNTER — Ambulatory Visit: Payer: No Typology Code available for payment source | Attending: Internal Medicine

## 2019-05-12 DIAGNOSIS — Z20822 Contact with and (suspected) exposure to covid-19: Secondary | ICD-10-CM

## 2019-05-14 LAB — NOVEL CORONAVIRUS, NAA: SARS-CoV-2, NAA: NOT DETECTED

## 2019-08-09 ENCOUNTER — Emergency Department: Payer: No Typology Code available for payment source

## 2019-08-09 ENCOUNTER — Other Ambulatory Visit: Payer: Self-pay

## 2019-08-09 ENCOUNTER — Emergency Department
Admission: EM | Admit: 2019-08-09 | Discharge: 2019-08-09 | Disposition: A | Discharge status: Home / Self Care | Payer: No Typology Code available for payment source | Attending: Emergency Medicine | Admitting: Emergency Medicine

## 2019-08-09 DIAGNOSIS — R102 Pelvic and perineal pain: Secondary | ICD-10-CM | POA: Diagnosis not present

## 2019-08-09 DIAGNOSIS — N8312 Corpus luteum cyst of left ovary: Secondary | ICD-10-CM | POA: Diagnosis not present

## 2019-08-09 DIAGNOSIS — Z7982 Long term (current) use of aspirin: Secondary | ICD-10-CM | POA: Diagnosis not present

## 2019-08-09 DIAGNOSIS — R1032 Left lower quadrant pain: Secondary | ICD-10-CM | POA: Diagnosis present

## 2019-08-09 LAB — COMPREHENSIVE METABOLIC PANEL
ALT: 26 U/L (ref 0–44)
AST: 24 U/L (ref 15–41)
Albumin: 5.1 g/dL — ABNORMAL HIGH (ref 3.5–5.0)
Alkaline Phosphatase: 78 U/L (ref 38–126)
Anion gap: 10 (ref 5–15)
BUN: 14 mg/dL (ref 6–20)
CO2: 26 mmol/L (ref 22–32)
Calcium: 9.3 mg/dL (ref 8.9–10.3)
Chloride: 102 mmol/L (ref 98–111)
Creatinine, Ser: 0.9 mg/dL (ref 0.44–1.00)
GFR calc Af Amer: >60 mL/min (ref >60)
GFR calc non Af Amer: >60 mL/min (ref >60)
Glucose, Bld: 107 mg/dL — ABNORMAL HIGH (ref 70–99)
Potassium: 3.6 mmol/L (ref 3.5–5.1)
Sodium: 138 mmol/L (ref 135–145)
Total Bilirubin: 0.9 mg/dL (ref 0.3–1.2)
Total Protein: 8.2 g/dL — ABNORMAL HIGH (ref 6.5–8.1)

## 2019-08-09 LAB — URINALYSIS, COMPLETE (UACMP) WITH MICROSCOPIC
Bilirubin Urine: NEGATIVE
Glucose, UA: NEGATIVE mg/dL
Ketones, ur: 5 mg/dL — AB
Leukocytes,Ua: NEGATIVE
Nitrite: NEGATIVE
Protein, ur: NEGATIVE mg/dL
Specific Gravity, Urine: 1.018 (ref 1.005–1.030)
pH: 5 (ref 5.0–8.0)

## 2019-08-09 LAB — WET PREP, GENITAL
Clue Cells Wet Prep HPF POC: NONE SEEN
Sperm: NONE SEEN
Trich, Wet Prep: NONE SEEN
Yeast Wet Prep HPF POC: NONE SEEN

## 2019-08-09 LAB — CBC
HCT: 41.3 % (ref 36.0–46.0)
Hemoglobin: 13.6 g/dL (ref 12.0–15.0)
MCH: 28.7 pg (ref 26.0–34.0)
MCHC: 32.9 g/dL (ref 30.0–36.0)
MCV: 87.1 fL (ref 80.0–100.0)
Platelets: 237 10*3/uL (ref 150–400)
RBC: 4.74 MIL/uL (ref 3.87–5.11)
RDW: 13.1 % (ref 11.5–15.5)
WBC: 5.7 10*3/uL (ref 4.0–10.5)
nRBC: 0 % (ref 0.0–0.2)

## 2019-08-09 LAB — CHLAMYDIA/NGC RT PCR (ARMC ONLY)
Chlamydia Tr: NOT DETECTED
N gonorrhoeae: NOT DETECTED

## 2019-08-09 LAB — POC URINE PREG, ED: Preg Test, Ur: NEGATIVE

## 2019-08-09 LAB — LIPASE, BLOOD: Lipase: 25 U/L (ref 11–51)

## 2019-08-09 LAB — HCG, QUANTITATIVE, PREGNANCY: hCG, Beta Chain, Quant, S: <1 m[IU]/mL (ref <5)

## 2019-08-09 MED ORDER — ONDANSETRON HCL 4 MG/2ML IJ SOLN
4.0000 mg | Freq: Once | INTRAMUSCULAR | Status: AC
  Administered 2019-08-09: 4 mg via INTRAVENOUS
  Filled 2019-08-09: qty 2

## 2019-08-09 MED ORDER — MORPHINE SULFATE (PF) 4 MG/ML IV SOLN
4.0000 mg | Freq: Once | INTRAVENOUS | Status: AC
  Administered 2019-08-09: 4 mg via INTRAVENOUS
  Filled 2019-08-09: qty 1

## 2019-08-09 MED ORDER — IOHEXOL 300 MG/ML  SOLN
100.0000 mL | Freq: Once | INTRAMUSCULAR | Status: AC | PRN
  Administered 2019-08-09: 100 mL via INTRAVENOUS
  Filled 2019-08-09: qty 100

## 2019-08-09 MED ORDER — OXYCODONE-ACETAMINOPHEN 5-325 MG PO TABS: 1.0000 | ORAL_TABLET | Freq: Four times a day (QID) | ORAL | 0 refills | Status: DC | PRN

## 2019-08-09 MED ORDER — ONDANSETRON 4 MG PO TBDP: 4.0000 mg | ORAL_TABLET | Freq: Four times a day (QID) | ORAL | 0 refills | Status: DC | PRN

## 2019-08-09 MED ORDER — IOHEXOL 9 MG/ML PO SOLN
1000.0000 mL | Freq: Two times a day (BID) | ORAL | Status: DC | PRN
  Administered 2019-08-09: 500 mL via ORAL
  Filled 2019-08-09 (×2): qty 1000

## 2019-08-09 MED ORDER — KETOROLAC TROMETHAMINE 30 MG/ML IJ SOLN
30.0000 mg | Freq: Once | INTRAMUSCULAR | Status: AC
  Administered 2019-08-09: 30 mg via INTRAVENOUS
  Filled 2019-08-09: qty 1

## 2019-08-09 NOTE — ED Triage Notes (Signed)
Pt presents via POV to ed with c/o right-sided pelvic pain. Pt states pain has been ongoing for 10 days. Pt abdomen tender to touch in RLQ and LLQ. Pt states there is chance she could be pregnant. Pt tearful at this time. Pt states family hx of ovarian cancer. Pt endorses burning with urination as well.

## 2019-08-09 NOTE — ED Notes (Signed)
Pt to ultrsound

## 2019-08-09 NOTE — ED Provider Notes (Signed)
[facility] Emergency Department Provider Note   ____________________________________________   First MD Initiated Contact with Patient 08/09/19 1734     (approximate)  I have reviewed the triage vital signs and the nursing notes.   HISTORY  Chief Complaint Abdominal Pain    HPI Monique Pacheco is a 29 y.o. female reports no major past medical history except for having significant pain and issues with previous IUD placement that since been removed  For about the last 10 days she has been experiencing a pretty severe left lower quadrant to mid lower abdominal pain.  Associated with nausea.  Reports she is checked a couple pregnancy tests that have been negative.  Did have a slight amount of vaginal bleeding this morning, last menstrual cycle was towards the late February  She reports she felt like she is having fevers and chills starting the last couple of days.  Having quite a bit of pain now in her left lower abdomen.  Sharp severe in nature  No vomiting but feels nauseated.  No black or bloody stools.  Has had a previous fasciotomy of the right lower leg no issues ongoing  Past Medical History:  Diagnosis Date  . Family history of adverse reaction to anesthesia    Mother bp bottoms out  . GERD (gastroesophageal reflux disease)     Patient Active Problem List   Diagnosis Date Noted  . Intractable pain 11/02/2018    Past Surgical History:  Procedure Laterality Date  . FASCIOTOMY Right 11/02/2018   Procedure: FASCIOTOMY RIGHT THIGH;  Surgeon: Juanell Fairly, MD;  Location: [facility] ORS;  Service: Orthopedics;  Laterality: Right;  . INCISION AND DRAINAGE Right 11/05/2018   Procedure: Right fasciotomy closure;  Surgeon: Juanell Fairly, MD;  Location: [facility] ORS;  Service: Orthopedics;  Laterality: Right;  . SECONDARY CLOSURE OF WOUND Right 04/29/2019   Procedure: RIGHT THIGH REPAIR OF FASCIA;  Surgeon: Juanell Fairly, MD;  Location: [facility]  ORS;  Service: Orthopedics;  Laterality: Right;  . TONSILLECTOMY      Prior to Admission medications   Medication Sig Start Date End Date Taking? Authorizing Provider  aspirin EC 325 MG tablet Take 1 tablet (325 mg total) by mouth daily. 04/29/19   Juanell Fairly, MD  aspirin-acetaminophen-caffeine (EXCEDRIN MIGRAINE) [id number] MG tablet Take 2 tablets by mouth every 8 (eight) hours as needed for headache.    [provider]  Multiple Vitamin (MULTIVITAMIN WITH MINERALS) TABS tablet Take 1 tablet by mouth 2 (two) times daily.    [provider]  Omega-3 Fatty Acids (FISH OIL) 1000 MG CAPS Take 1,000 mg by mouth daily.    [provider]  omeprazole (PRILOSEC) 20 MG capsule Take 20 mg by mouth daily as needed (acid reflux).    [provider]  ondansetron (ZOFRAN ODT) 4 MG disintegrating tablet Take 1 tablet (4 mg total) by mouth every 6 (six) hours as needed for nausea or vomiting. 08/09/19   Sharyn Creamer, MD  ondansetron (ZOFRAN) 4 MG tablet Take 1 tablet (4 mg total) by mouth every 8 (eight) hours as needed for nausea or vomiting. 04/29/19   Juanell Fairly, MD  OVER THE COUNTER MEDICATION Take 1 capsule by mouth daily. Cellwise otc supplement    [provider]  OVER THE COUNTER MEDICATION Take 1 tablet by mouth 2 (two) times daily. Recover AI otc supplement    [provider]  oxyCODONE (OXY IR/ROXICODONE) 5 MG immediate release tablet Take 1 tablet (5 mg total) by  mouth every 4 (four) hours as needed. 04/29/19   Juanell Fairly, MD  oxyCODONE-acetaminophen (PERCOCET/ROXICET) 5-325 MG tablet Take 1 tablet by mouth every 6 (six) hours as needed for severe pain. 08/09/19   Sharyn Creamer, MD  Probiotic CAPS Take 1 capsule by mouth daily.    [provider]    Allergies Patient has no known allergies.  No family history on file.  Social History Social History   Tobacco Use  . Smoking status: Never Smoker  . Smokeless  tobacco: Never Used  Substance Use Topics  . Alcohol use: Yes    Comment: occasionally  . Drug use: No    Review of Systems Constitutional: Like she is having some fevers and chills starting yesterday Eyes: No visual changes. ENT: No sore throat.  Feels little sore in the muscles of her neck over the last couple days as well Cardiovascular: Denies chest pain. Respiratory: Denies shortness of breath. Gastrointestinal: See HPI regarding severe abdominal pain Genitourinary: Negative for dysuria. Musculoskeletal: Negative for back pain except some in the left flank. Skin: Negative for rash. Neurological: Negative for headaches, areas of focal weakness or numbness.    ____________________________________________   PHYSICAL EXAM:  VITAL SIGNS: ED Triage Vitals  Enc Vitals Group     BP 08/09/19 1728 133/86     Pulse Rate 08/09/19 1728 86     Resp 08/09/19 1728 19     Temp 08/09/19 1728 98.1 F (36.7 C)     Temp Source 08/09/19 1728 Oral     SpO2 08/09/19 1728 99 %     Weight 08/09/19 1729 190 lb (86.2 kg)     Height 08/09/19 1729 5\' 7"  (1.702 m)     Head Circumference --      Peak Flow --      Pain Score 08/09/19 1728 8     Pain Loc --      Pain Edu? --      Excl. in GC? --     Constitutional: Alert and oriented.  Appears in painful distress wincing holding hand over left lower abdomen. Eyes: Conjunctivae are normal. Head: Atraumatic. Nose: No congestion/rhinnorhea. Mouth/Throat: Mucous membranes are moist. Neck: No stridor.  No meningismus. Cardiovascular: Normal rate, regular rhythm. Grossly normal heart sounds.  Good peripheral circulation. Respiratory: Normal respiratory effort.  No retractions. Lungs CTAB. Gastrointestinal: Soft and moderately tender especially involving the left lower quadrant left flank.  No pain to percussion or rebound.  Does report that palpation of the right side of the abdomen refers pain towards the left lower quadrant.  No distention. GU:  External exam normal.  Internal exam demonstrates small amount of dark slightly bloody discharge.  No cervical motion tenderness on digital exam.  No noted or significant adnexal tenderness.  I did accidentally pinch the patient's labia slightly with the speculum as removing it, with no injury denoted and apologized to patient. escorted by nurse 08/11/19 Musculoskeletal: No lower extremity tenderness nor edema. Neurologic:  Normal speech and language. No gross focal neurologic deficits are appreciated.  Skin:  Skin is warm, dry and intact. No rash noted. Psychiatric: Mood and affect are normal. Speech and behavior are normal.  ____________________________________________   LABS (all labs ordered are listed, but only abnormal results are displayed)  Labs Reviewed  WET PREP, GENITAL - Abnormal; Notable for the following components:      Result Value   WBC, Wet Prep HPF POC MODERATE (*)    All other components within normal limits  COMPREHENSIVE METABOLIC PANEL - Abnormal; Notable for the following components:   Glucose, Bld 107 (*)    Total Protein 8.2 (*)    Albumin 5.1 (*)    All other components within normal limits  URINALYSIS, COMPLETE (UACMP) WITH MICROSCOPIC - Abnormal; Notable for the following components:   Color, Urine YELLOW (*)    APPearance CLEAR (*)    Hgb urine dipstick LARGE (*)    Ketones, ur 5 (*)    Bacteria, UA RARE (*)    All other components within normal limits  CHLAMYDIA/NGC RT PCR ([facility] ONLY)  CBC  LIPASE, BLOOD  HCG, QUANTITATIVE, PREGNANCY  POC URINE PREG, ED   ____________________________________________  EKG   ____________________________________________  RADIOLOGY  CT ABDOMEN PELVIS W CONTRAST  Result Date: 08/09/2019 CLINICAL DATA:  Right-sided pelvic pain. EXAM: CT ABDOMEN AND PELVIS WITH CONTRAST TECHNIQUE: Multidetector CT imaging of the abdomen and pelvis was performed using the standard protocol following bolus administration of  intravenous contrast. CONTRAST:  OMNIPAQUE IOHEXOL 300 MG/ML  SOLN COMPARISON:  None. FINDINGS: Lower chest: No acute abnormality. Hepatobiliary: A 2 mm focus of parenchymal low attenuation is seen within the right lobe of the liver (axial CT image 16, CT series number 2). No gallstones, gallbladder wall thickening, or biliary dilatation. Pancreas: Unremarkable. No pancreatic ductal dilatation or surrounding inflammatory changes. Spleen: Normal in size without focal abnormality. Adrenals/Urinary Tract: Adrenal glands are unremarkable. Kidneys are normal, without renal calculi, focal lesion, or hydronephrosis. Bladder is unremarkable. Stomach/Bowel: Stomach is within normal limits. Appendix appears normal. No evidence of bowel wall thickening, distention, or inflammatory changes. Vascular/Lymphatic: No significant vascular findings are present. No enlarged abdominal or pelvic lymph nodes. Reproductive: The uterus is normal in appearance. Multiple subcentimeter cysts are seen within the bilateral adnexa. Other: No abdominal wall hernia or abnormality. No abdominopelvic ascites. Musculoskeletal: No acute or significant osseous findings. IMPRESSION: 1. Punctate focus of low attenuation within the right lobe of the liver which may represent a small hepatic cyst versus hemangioma. 2. Multiple bilateral subcentimeter adnexal cysts, likely ovarian in origin. Electronically Signed   By: Aram Candela M.D.   On: 08/09/2019 21:11   US PELVIC COMPLETE W TRANSVAGINAL AND TORSION R/O  Result Date: 08/09/2019 CLINICAL DATA:  Initial evaluation for acute pelvic pain for 10 days. EXAM: TRANSABDOMINAL AND TRANSVAGINAL ULTRASOUND OF PELVIS DOPPLER ULTRASOUND OF OVARIES TECHNIQUE: Both transabdominal and transvaginal ultrasound examinations of the pelvis were performed. Transabdominal technique was performed for global imaging of the pelvis including uterus, ovaries, adnexal regions, and pelvic cul-de-sac. It was  necessary to proceed with endovaginal exam following the transabdominal exam to visualize the uterus, endometrium, and ovaries. Color and duplex Doppler ultrasound was utilized to evaluate blood flow to the ovaries. COMPARISON:  None available. FINDINGS: Uterus Measurements: 6.6 x 2.7 x 5.2 cm = volume: 65.6 mL. No fibroids or other mass visualized. Endometrium Thickness: 11.0 mm.  No focal abnormality visualized. Right ovary Measurements: 3.2 x 2.0 x 2.3 cm = volume: 7.5 mL. Normal appearance/no adnexal mass. Left ovary Measurements: 3.5 x 2.7 x 1.9 cm = volume: 9.5 mL. Note made of a 1.3 x 1.2 x 1.0 cm complex lesion, most characteristic of a small degenerating corpus luteal cyst. No other adnexal mass. Pulsed Doppler evaluation of both ovaries demonstrates normal low-resistance arterial and venous waveforms. Other findings Small volume free fluid within the pelvis. IMPRESSION: 1. 1.3 cm degenerating left ovarian corpus luteal cyst with associated small volume free fluid within the pelvis. 2.  Otherwise unremarkable and normal pelvic ultrasound. No evidence for torsion or other acute abnormality. Electronically Signed   By: Jeannine Boga M.D.   On: 08/09/2019 19:37     Review of imaging studies.  Notable for left ovarian cyst with small free fluid in the pelvis. Punctate focus of low attenuation within the right lobe of the liver which may represent a small hepatic cyst versus hemangioma. Multiple bilateral subcentimeter adnexal cysts, likely ovarian in origin.   ____________________________________________   PROCEDURES  Procedure(s) performed: None  Procedures  Critical Care performed: No  ____________________________________________   INITIAL IMPRESSION / ASSESSMENT AND PLAN / ED COURSE  Pertinent labs & imaging results that were available during my care of the patient were reviewed by me and considered in my medical decision making (see chart for details).   Differential diagnosis  includes but is not limited to, abdominal perforation, aortic dissection, cholecystitis, appendicitis, diverticulitis, colitis, esophagitis/gastritis, kidney stone, pyelonephritis, urinary tract infection, aortic aneurysm. All are considered in decision and treatment plan. Based upon the patient's presentation and risk factors, and description of the pain was small nonbleeding I am concerned about possible gynecologic pathology but also given the chronicity of her symptoms for 10 days certainly entertain other causes such as etiology such as kidney stone, perforated appendicitis, colitis, etc.  Negative pregnancy test.  Clinical Course as of Aug 08 2128  Mon Aug 09, 2019  1827 Lab work reviewed to this point, this far is reassuring. Hematuria (but reports seeing some blood (small amount with wiping) from vagina today)   [MQ]    Clinical Course User Index [MQ] Delman Kitten, MD   ----------------------------------------- 6:35 PM on 08/09/2019 -----------------------------------------  Reviewed lab work with patient.  Patient reports her pain is much better now.  Pain appears much better controlled  ----------------------------------------- 9:31 PM on 08/09/2019 -----------------------------------------   Patient's pain well controlled.  In review of her work-up today which is rather extensive, and the fact that she feels much improved, I suspect most likely she had a ruptured ovarian cyst at the heart of her etiology today.  There is no evidence of infection.  Reassuring relief of pain at this time.  She feels much better appears well, no evidence of acute abdomen  Return precautions and treatment recommendations and follow-up discussed with the patient who is agreeable with the plan.  I will prescribe the patient a narcotic pain medicine due to their condition which I anticipate will cause at least moderate pain short term. I discussed with the patient safe use of narcotic pain medicines,  and that they are not to drive, work in dangerous areas, or ever take more than prescribed (no more than 1 pill every 6 hours). We discussed that this is the type of medication that can be  overdosed on and the risks of this type of medicine. Patient is very agreeable to only use as prescribed and to never use more than prescribed.   ____________________________________________   FINAL CLINICAL IMPRESSION(S) / ED DIAGNOSES  Final diagnoses:  Hemorrhage of corpus luteum cyst of left ovary        Note:  This document was prepared using Dragon voice recognition software and may include unintentional dictation errors       Delman Kitten, MD 08/09/19 2132

## 2019-08-09 NOTE — Discharge Instructions (Signed)
No driving today or while taking oxycodone.  °

## 2019-08-30 ENCOUNTER — Other Ambulatory Visit: Payer: Self-pay

## 2019-08-30 ENCOUNTER — Other Ambulatory Visit (HOSPITAL_COMMUNITY)
Admission: RE | Admit: 2019-08-30 | Discharge: 2019-08-30 | Disposition: A | Discharge status: Home / Self Care | Payer: No Typology Code available for payment source | Source: Ambulatory Visit | Attending: Obstetrics & Gynecology | Admitting: Obstetrics & Gynecology

## 2019-08-30 ENCOUNTER — Ambulatory Visit (INDEPENDENT_AMBULATORY_CARE_PROVIDER_SITE_OTHER): Payer: No Typology Code available for payment source | Admitting: Obstetrics & Gynecology

## 2019-08-30 ENCOUNTER — Encounter: Payer: Self-pay | Admitting: Obstetrics & Gynecology

## 2019-08-30 VITALS — BP 100/60 | Ht 67.0 in | Wt 194.0 lb

## 2019-08-30 DIAGNOSIS — R1032 Left lower quadrant pain: Secondary | ICD-10-CM | POA: Diagnosis not present

## 2019-08-30 DIAGNOSIS — Z124 Encounter for screening for malignant neoplasm of cervix: Secondary | ICD-10-CM | POA: Insufficient documentation

## 2019-08-30 DIAGNOSIS — Z3045 Encounter for surveillance of transdermal patch hormonal contraceptive device: Secondary | ICD-10-CM

## 2019-08-30 DIAGNOSIS — Z8742 Personal history of other diseases of the female genital tract: Secondary | ICD-10-CM

## 2019-08-30 DIAGNOSIS — N83202 Unspecified ovarian cyst, left side: Secondary | ICD-10-CM | POA: Diagnosis not present

## 2019-08-30 MED ORDER — XULANE 150-35 MCG/24HR TD PTWK: 1.0000 | MEDICATED_PATCH | TRANSDERMAL | 11 refills | Status: DC

## 2019-08-30 NOTE — Progress Notes (Signed)
HPI: Patient is a 29 y.o. G1P0010 who LMP was Patient's last menstrual period was 08/10/2019., presents today for a problem visit.  She complains of recent findings of Left ovarion cyst by Ultrasound - Pelvic Vaginal.  Pt has had symptoms of pain, swelling.  She went to ER 3 weeks ago due to bilateral lower quadrant pain as well as headaches and bloating and pain w void.  She had her period then.  She has been told she has had ovarian cysts in past, and was dx with left sided 1.3 cm cyst at this ER visit.  Since it was small and appeared to have spontaneously ruptured, she was treated w pain medicine and went home.  Pain has persisted although less severe.  She desires birth control, but is a poor pilll taker and had problems w IUD in past and is afraid of weight gain w Depo Provera.  She reports reg cycles.  Previous evaluation: emergency room visit on 08/09/19. Prior Diagnosis: prior ovarian cyst rupture. Previous Treatment: Percocet.  PMHx: She  has a past medical history of Family history of adverse reaction to anesthesia and GERD (gastroesophageal reflux disease). Also,  has a past surgical history that includes Tonsillectomy; Fasciotomy (Right, 11/02/2018); Incision and drainage (Right, 11/05/2018); and Secondary closure of wound (Right, 04/29/2019)., family history includes Cancer in her maternal aunt and maternal grandmother; Ovarian cancer (age of onset: 77) in her maternal aunt.,  reports that she has never smoked. She has never used smokeless tobacco. She reports current alcohol use. She reports that she does not use drugs.  She has a current medication list which includes the following prescription(s): aspirin-acetaminophen-caffeine, multivitamin with minerals, xulane, fish oil, omeprazole, OVER THE COUNTER MEDICATION, OVER THE COUNTER MEDICATION, and probiotic. Also, has No Known Allergies.  Review of Systems  Constitutional: Negative for chills, fever and malaise/fatigue.  HENT: Negative  for congestion, sinus pain and sore throat.   Eyes: Negative for blurred vision and pain.  Respiratory: Negative for cough and wheezing.   Cardiovascular: Negative for chest pain and leg swelling.  Gastrointestinal: Positive for abdominal pain. Negative for constipation, diarrhea, heartburn, nausea and vomiting.  Genitourinary: Negative for dysuria, frequency, hematuria and urgency.  Musculoskeletal: Negative for back pain, joint pain, myalgias and neck pain.  Skin: Negative for itching and rash.  Neurological: Positive for headaches. Negative for dizziness, tremors and weakness.  Endo/Heme/Allergies: Does not bruise/bleed easily.  Psychiatric/Behavioral: Negative for depression. The patient is not nervous/anxious and does not have insomnia.     Objective: BP 100/60   Ht 5\' 7"  (1.702 m)   Wt 194 lb (88 kg)   LMP 08/10/2019   BMI 30.38 kg/m  Physical Exam Constitutional:      General: She is not in acute distress.    Appearance: She is well-developed.  Genitourinary:     Pelvic exam was performed with patient supine.     Urethra, bladder, vagina, uterus and rectum normal.     No lesions in the vagina.     No vaginal bleeding.     No cervical motion tenderness, friability, lesion or polyp.     Uterus is mobile.     Uterus is not enlarged.     No uterine mass detected.    Uterus is midaxial.     No right or left adnexal mass present.     Right adnexa not tender.     Left adnexa tender.     Genitourinary Comments: No mass, mildly T adnexa  HENT:     Head: Normocephalic and atraumatic. No laceration.     Right Ear: Hearing normal.     Left Ear: Hearing normal.     Mouth/Throat:     Pharynx: Uvula midline.  Eyes:     Pupils: Pupils are equal, round, and reactive to light.  Neck:     Thyroid: No thyromegaly.  Cardiovascular:     Rate and Rhythm: Normal rate and regular rhythm.     Heart sounds: No murmur. No friction rub. No gallop.   Pulmonary:     Effort: Pulmonary  effort is normal. No respiratory distress.     Breath sounds: Normal breath sounds. No wheezing.  Chest:     Breasts:        Right: No mass, skin change or tenderness.        Left: No mass, skin change or tenderness.  Abdominal:     General: Bowel sounds are normal. There is no distension.     Palpations: Abdomen is soft.     Tenderness: There is no abdominal tenderness. There is no rebound.     Comments: Mild LLQ T  Musculoskeletal:        General: Normal range of motion.     Cervical back: Normal range of motion and neck supple.  Neurological:     Mental Status: She is alert and oriented to person, place, and time.     Cranial Nerves: No cranial nerve deficit.  Skin:    General: Skin is warm and dry.  Psychiatric:        Judgment: Judgment normal.  Vitals reviewed.     ASSESSMENT/PLAN:    Problem List Items Addressed This Visit      Other   History of ovarian cyst - Primary/New Diagnosis for patient   Relevant Medications   norelgestromin-ethinyl estradiol [employer]) 150-35 MCG/24HR transdermal patch   Other Relevant Orders   US PELVIC COMPLETE WITH TRANSVAGINAL    Other Visit Diagnoses    Screening for cervical cancer  - as this is due     Relevant Orders   Cytology - PAP   Encounter for start of  transdermal patch hormonal contraceptive device       Relevant Medications   norelgestromin-ethinyl estradiol [employer]) 150-35 MCG/24HR transdermal patch     Will start Xulane birth control/ hormone patch now for contraception as well as potential prevention of future cysts  [place] in 3 weeks to re-assess cysts size and number, will correlate with pain sx's as well  Pros and cons of surgery for cysts, as well as for pelvic pain investigation, if persists  OCPs, Patches, Rings The risks /benefits of hormonal contraception have been explained to the patient in detail.  Product literature has been given to her.  I have instructed her in the use of patches and have given her  literature reinforcing this information.  I have explained to the patient that patches are not as effective for birth control during the first month of use, and that another form of contraception should be used during this time.  Both first-day start and Sunday start have been explained.  The risks and benefits of each was discussed.  She has been made aware of  the fact that other medications may affect the efficacy of patches.  I have answered all of her questions, and I believe that she has an understanding of the effectiveness and use of patches.   Annamarie Major, MD, FACOG [facility] 08/30/2019  8:26 AM

## 2019-08-30 NOTE — Patient Instructions (Signed)
Ethinyl Estradiol; Norelgestromin skin patches What is this medicine? ETHINYL ESTRADIOL;NORELGESTROMIN (ETH in il es tra DYE ole; nor el JES troe min) skin patch is used as a contraceptive (birth control method). This medicine combines two types of female hormones, an estrogen and a progestin. This patch is used to prevent ovulation and pregnancy. This medicine may be used for other purposes; ask your health care provider or pharmacist if you have questions. COMMON BRAND NAME(S): Ortho Evra, Xulane What should I tell my health care provider before I take this medicine? They need to know if you have or ever had any of these conditions:  abnormal vaginal bleeding  blood vessel disease or blood clots  breast, cervical, endometrial, ovarian, liver, or uterine cancer  diabetes  gallbladder disease  having surgery  heart disease or recent heart attack  high blood pressure  high cholesterol or triglycerides  history of irregular heartbeat or heart valve problems  kidney disease  liver disease  migraine headaches  protein C deficiency  protein S deficiency  recently had a baby, miscarriage, or abortion  stroke  systemic lupus erythematosus (SLE)  tobacco smoker  an unusual or allergic reaction to estrogens, progestins, other medicines, foods, dyes, or preservatives  pregnant or trying to get pregnant  breast-feeding How should I use this medicine? This patch is applied to the skin. Follow the directions on the prescription label. Apply to clean, dry, healthy skin on the buttock, abdomen, upper outer arm or upper torso, in a place where it will not be rubbed by tight clothing. Do not use lotions or other cosmetics on the site where the patch will go. Press the patch firmly in place for 10 seconds to ensure good contact with the skin. Change the patch every 7 days on the same day of the week for 3 weeks. You will then have a break from the patch for 1 week, after which you  will apply a new patch. Do not use your medicine more often than directed. Contact your pediatrician regarding the use of this medicine in children. Special care may be needed. This medicine has been used in female children who have started having menstrual periods. A patient package insert for the product will be given with each prescription and refill. Read this sheet carefully each time. The sheet may change frequently. Overdosage: If you think you have taken too much of this medicine contact a poison control center or emergency room at once. NOTE: This medicine is only for you. Do not share this medicine with others. What if I miss a dose? You will need to replace your patch once a week as directed. If your patch is lost or falls off, contact your health care professional for advice. You may need to use another form of birth control if your patch has been off for more than 1 day. What may interact with this medicine? Do not take this medicine with the following medications:  dasabuvir; ombitasvir; paritaprevir; ritonavir  ombitasvir; paritaprevir; ritonavir This medicine may also interact with the following medications:  acetaminophen  antibiotics or medicines for infections, especially rifampin, rifabutin, rifapentine, and possibly penicillins or tetracyclines  aprepitant or fosaprepitant  armodafinil  ascorbic acid (vitamin C)  barbiturate medicines, such as phenobarbital or primidone  bosentan  certain antiviral medicines for hepatitis, HIV or AIDS  certain medicines for cancer treatment  certain medicines for seizures like carbamazepine, clobazam, felbamate, lamotrigine, oxcarbazepine, phenytoin, rufinamide, topiramate  certain medicines for treating high cholesterol  cyclosporine    dantrolene  elagolix  flibanserin  grapefruit juice  lesinurad  medicines for diabetes  medicines to treat fungal infections, such as griseofulvin, miconazole, fluconazole,  ketoconazole, itraconazole, posaconazole or voriconazole  mifepristone  mitotane  modafinil  morphine  mycophenolate  St. John's wort  tamoxifen  temazepam  theophylline or aminophylline  thyroid hormones  tizanidine  tranexamic acid  ulipristal  warfarin This list may not describe all possible interactions. Give your health care provider a list of all the medicines, herbs, non-prescription drugs, or dietary supplements you use. Also tell them if you smoke, drink alcohol, or use illegal drugs. Some items may interact with your medicine. What should I watch for while using this medicine? Visit your doctor or health care professional for regular checks on your progress. You will need a regular breast and pelvic exam and Pap smear while on this medicine. Use an additional method of contraception during the first cycle that you use this patch. If you have any reason to think you are pregnant, stop using this medicine right away and contact your doctor or health care professional. If you are using this medicine for hormone related problems, it may take several cycles of use to see improvement in your condition. Smoking increases the risk of getting a blood clot or having a stroke while you are using hormonal birth control, especially if you are more than 28 years old. You are strongly advised not to smoke. This medicine can make your body retain fluid, making your fingers, hands, or ankles swell. Your blood pressure can go up. Contact your doctor or health care professional if you feel you are retaining fluid. This medicine can make you more sensitive to the sun. Keep out of the sun. If you cannot avoid being in the sun, wear protective clothing and use sunscreen. Do not use sun lamps or tanning beds/booths. If you wear contact lenses and notice visual changes, or if the lenses begin to feel uncomfortable, consult your eye care specialist. In some women, tenderness, swelling, or  minor bleeding of the gums may occur. Notify your dentist if this happens. Brushing and flossing your teeth regularly may help limit this. See your dentist regularly and inform your dentist of the medicines you are taking. If you are going to have elective surgery or a MRI, you may need to stop using this medicine before the surgery or MRI. Consult your health care professional for advice. This medicine does not protect you against HIV infection (AIDS) or any other sexually transmitted diseases. What side effects may I notice from receiving this medicine? Side effects that you should report to your doctor or health care professional as soon as possible:  allergic reactions such as skin rash or itching, hives, swelling of the lips, mouth, tongue, or throat  breast tissue changes or discharge  dark patches of skin on your forehead, cheeks, upper lip, and chin  depression  high blood pressure  migraines or severe, sudden headaches  missed menstrual periods  signs and symptoms of a blood clot such as breathing problems; changes in vision; chest pain; severe, sudden headache; pain, swelling, warmth in the leg; trouble speaking; sudden numbness or weakness of the face, arm or leg  skin reactions at the patch site such as blistering, bleeding, itching, rash, or swelling  stomach pain  yellowing of the eyes or skin Side effects that usually do not require medical attention (report these to your doctor or health care professional if they continue or are bothersome):    breast tenderness  irregular vaginal bleeding or spotting, particularly during the first 3 months of use  headache  nausea  painful menstrual periods  skin redness or mild irritation at site where applied  weight gain (slight) This list may not describe all possible side effects. Call your doctor for medical advice about side effects. You may report side effects to FDA at 1-800-FDA-1088. Where should I keep my  medicine? Keep out of the reach of children. Store at room temperature between 15 and 30 degrees C (59 and 86 degrees F). Keep the patch in its pouch until time of use. Throw away any unused medicine after the expiration date. Dispose of used patches properly. Since a used patch may still contain active hormones, fold the patch in half so that it sticks to itself prior to disposal. Throw away in a place where children or pets cannot reach. NOTE: This sheet is a summary. It may not cover all possible information. If you have questions about this medicine, talk to your doctor, pharmacist, or health care provider.  2020 [employer]vier/Gold Standard (2018-08-11 11:56:29)  

## 2019-08-31 LAB — CYTOLOGY - PAP: Diagnosis: NEGATIVE

## 2019-09-01 ENCOUNTER — Other Ambulatory Visit: Payer: Self-pay

## 2019-09-01 DIAGNOSIS — Z3045 Encounter for surveillance of transdermal patch hormonal contraceptive device: Secondary | ICD-10-CM

## 2019-09-01 DIAGNOSIS — Z8742 Personal history of other diseases of the female genital tract: Secondary | ICD-10-CM

## 2019-09-01 MED ORDER — XULANE 150-35 MCG/24HR TD PTWK: 1.0000 | MEDICATED_PATCH | TRANSDERMAL | 11 refills | Status: DC

## 2019-09-02 NOTE — Telephone Encounter (Signed)
Can you see if there is a fax related to this patient for me?

## 2019-09-20 ENCOUNTER — Other Ambulatory Visit: Payer: Self-pay | Admitting: Obstetrics & Gynecology

## 2019-09-20 ENCOUNTER — Encounter: Payer: Self-pay | Admitting: Obstetrics & Gynecology

## 2019-09-20 ENCOUNTER — Ambulatory Visit (INDEPENDENT_AMBULATORY_CARE_PROVIDER_SITE_OTHER): Payer: No Typology Code available for payment source | Admitting: Obstetrics & Gynecology

## 2019-09-20 ENCOUNTER — Ambulatory Visit (INDEPENDENT_AMBULATORY_CARE_PROVIDER_SITE_OTHER): Payer: No Typology Code available for payment source

## 2019-09-20 ENCOUNTER — Other Ambulatory Visit: Payer: Self-pay

## 2019-09-20 DIAGNOSIS — Z3045 Encounter for surveillance of transdermal patch hormonal contraceptive device: Secondary | ICD-10-CM

## 2019-09-20 DIAGNOSIS — Z8742 Personal history of other diseases of the female genital tract: Secondary | ICD-10-CM | POA: Diagnosis not present

## 2019-09-20 MED ORDER — XULANE 150-35 MCG/24HR TD PTWK: 1.0000 | MEDICATED_PATCH | TRANSDERMAL | 11 refills | Status: DC

## 2019-09-20 NOTE — Progress Notes (Signed)
Virtual Visit via Telephone Note  I connected with Monique Pacheco on 09/20/19 at 10:20 AM EDT by telephone and verified that I am speaking with the correct person using two identifiers.   I discussed the limitations, risks, security and privacy concerns of performing an evaluation and management service by telephone and the availability of in person appointments. I also discussed with the patient that there may be a patient responsible charge related to this service. The patient expressed understanding and agreed to proceed. She was at home and I was in my office.  History of Present Illness: Abdominal Pain Patient presents for evaluation of abdominal pain. The pain is described as cramping, and is 6/10 in intensity. Pain is located in the LLQ area without radiation. Onset was intermittent occurring a few times over the last 2 mos . Symptoms have been unchanged since. Aggravating factors: none. Alleviating factors: none. Associated symptoms: none. The patient denies fever, headache, nausea and vomiting. Risk factors for pelvic/abdominal pain include small left ovarian cyst noted last month.  Pt has not strated OCP or patch yet (no hormones).  Ultrasound demonstrates left follicle, no dominant cysts.  2 cm follicle on left.  Right side normal.  PMHx: She  has a past medical history of Family history of adverse reaction to anesthesia and GERD (gastroesophageal reflux disease). Also,  has a past surgical history that includes Tonsillectomy; Fasciotomy (Right, 11/02/2018); Incision and drainage (Right, 11/05/2018); and Secondary closure of wound (Right, 04/29/2019)., family history includes Cancer in her maternal aunt and maternal grandmother; Ovarian cancer (age of onset: 79) in her maternal aunt.,  reports that she has never smoked. She has never used smokeless tobacco. She reports current alcohol use. She reports that she does not use drugs.  She has a current medication list which includes the  following prescription(s): aspirin-acetaminophen-caffeine, multivitamin with minerals, xulane, fish oil, omeprazole, OVER THE COUNTER MEDICATION, OVER THE COUNTER MEDICATION, and probiotic. Also, has No Known Allergies.  Review of Systems  All other systems reviewed and are negative.   US PELVIS TRANSVAGINAL NON-OB (TV ONLY)  Result Date: 09/20/2019 Patient Name: Monique Pacheco DOB: January 27, 1991 MRN: [id number] ULTRASOUND REPORT Location: [facility] Date of Service: 09/20/2019 Indications:Pelvic Pain Findings: The uterus is retroverted and measures 6.3 x 4.8 x 3.5 cm. Echo texture is homogenous without evidence of focal masses. The Endometrium measures 8.9 mm. Right Ovary measures 3.5 x 2.3 x 2.0 cm. It is normal in appearance. Left Ovary measures 3.3 x 2.5 x 2.8 cm. It is normal in appearance. There is a dominant follicle measuring 2.0 x 1.6 x 1.6 cm Survey of the adnexa demonstrates no adnexal masses. There is no free fluid in the cul de sac. Impression: 1. Normal pelvic ultrasound. Recommendations: 1.Clinical correlation with the patient's History and Physical Exam. Deanna Artis, RT Review of ULTRASOUND.    I have personally reviewed images and report of recent ultrasound done at [facility].    Plan of management to be discussed with patient. Annamarie Major, MD, FACOG [facility], [facility] 09/20/2019  10:30 AM   Observations/Objective: No exam today, due to telephone eVisit due to [facility] virus restriction on elective visits and procedures.  Prior visits reviewed along with ultrasounds/labs as indicated.  Assessment and Plan: History of ovarian cyst - Plan: norelgestromin-ethinyl estradiol [employer]) 150-35 MCG/24HR transdermal patch  Encounter for surveillance of transdermal patch hormonal contraceptive device - Plan: norelgestromin-ethinyl estradiol [employer]) 150-35 MCG/24HR transdermal patch  Follow Up Instructions: Based on painm sx's  Start Patch after next cycle and see  if helps suppress follicles and pain.  Also for birth control.   I discussed the assessment and treatment plan with the patient. The patient was provided an opportunity to ask questions and all were answered. The patient agreed with the plan and demonstrated an understanding of the instructions.   The patient was advised to call back or seek an in-person evaluation if the symptoms worsen or if the condition fails to improve as anticipated.  I provided 15 minutes of non-face-to-face time during this encounter.   Hoyt Koch, MD

## 2019-10-29 ENCOUNTER — Other Ambulatory Visit: Payer: Self-pay

## 2019-10-29 ENCOUNTER — Emergency Department: Payer: No Typology Code available for payment source

## 2019-10-29 ENCOUNTER — Emergency Department
Admission: EM | Admit: 2019-10-29 | Discharge: 2019-10-29 | Disposition: A | Discharge status: Home / Self Care | Payer: No Typology Code available for payment source | Attending: Emergency Medicine | Admitting: Emergency Medicine

## 2019-10-29 DIAGNOSIS — R110 Nausea: Secondary | ICD-10-CM | POA: Insufficient documentation

## 2019-10-29 DIAGNOSIS — Z5321 Procedure and treatment not carried out due to patient leaving prior to being seen by health care provider: Secondary | ICD-10-CM | POA: Diagnosis not present

## 2019-10-29 DIAGNOSIS — W5522XA Struck by cow, initial encounter: Secondary | ICD-10-CM | POA: Diagnosis not present

## 2019-10-29 DIAGNOSIS — R109 Unspecified abdominal pain: Secondary | ICD-10-CM | POA: Diagnosis present

## 2019-10-29 LAB — COMPREHENSIVE METABOLIC PANEL
ALT: 21 U/L (ref 0–44)
AST: 20 U/L (ref 15–41)
Albumin: 4.2 g/dL (ref 3.5–5.0)
Alkaline Phosphatase: 34 U/L — ABNORMAL LOW (ref 38–126)
Anion gap: 5 (ref 5–15)
BUN: 16 mg/dL (ref 6–20)
CO2: 26 mmol/L (ref 22–32)
Calcium: 8.5 mg/dL — ABNORMAL LOW (ref 8.9–10.3)
Chloride: 106 mmol/L (ref 98–111)
Creatinine, Ser: 0.85 mg/dL (ref 0.44–1.00)
GFR calc Af Amer: >60 mL/min (ref >60)
GFR calc non Af Amer: >60 mL/min (ref >60)
Glucose, Bld: 83 mg/dL (ref 70–99)
Potassium: 3.7 mmol/L (ref 3.5–5.1)
Sodium: 137 mmol/L (ref 135–145)
Total Bilirubin: 0.8 mg/dL (ref 0.3–1.2)
Total Protein: 7.2 g/dL (ref 6.5–8.1)

## 2019-10-29 LAB — CBC
HCT: 37 % (ref 36.0–46.0)
Hemoglobin: 11.9 g/dL — ABNORMAL LOW (ref 12.0–15.0)
MCH: 29 pg (ref 26.0–34.0)
MCHC: 32.2 g/dL (ref 30.0–36.0)
MCV: 90.2 fL (ref 80.0–100.0)
Platelets: 253 10*3/uL (ref 150–400)
RBC: 4.1 MIL/uL (ref 3.87–5.11)
RDW: 13.2 % (ref 11.5–15.5)
WBC: 6 10*3/uL (ref 4.0–10.5)
nRBC: 0 % (ref 0.0–0.2)

## 2019-10-29 LAB — HCG, QUANTITATIVE, PREGNANCY: hCG, Beta Chain, Quant, S: <1 m[IU]/mL (ref <5)

## 2019-10-29 MED ORDER — IOHEXOL 300 MG/ML  SOLN
100.0000 mL | Freq: Once | INTRAMUSCULAR | Status: AC | PRN
  Administered 2019-10-29: 100 mL via INTRAVENOUS

## 2019-10-29 NOTE — ED Notes (Signed)
Pt states she wants to leave. Pt encouraged to stay. Pt informed she may return at any time for any concern. Pt verbalizes understanding.

## 2019-10-29 NOTE — ED Notes (Signed)
Pt updated on wait. Pt verbalizes understanding.  
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
## 2020-12-26 ENCOUNTER — Other Ambulatory Visit: Payer: Self-pay

## 2020-12-26 ENCOUNTER — Emergency Department: Payer: No Typology Code available for payment source

## 2020-12-26 ENCOUNTER — Emergency Department
Admission: EM | Admit: 2020-12-26 | Discharge: 2020-12-26 | Disposition: A | Discharge status: Home / Self Care | Payer: No Typology Code available for payment source | Attending: Emergency Medicine | Admitting: Emergency Medicine

## 2020-12-26 DIAGNOSIS — O209 Hemorrhage in early pregnancy, unspecified: Secondary | ICD-10-CM

## 2020-12-26 DIAGNOSIS — R102 Pelvic and perineal pain: Secondary | ICD-10-CM | POA: Diagnosis not present

## 2020-12-26 DIAGNOSIS — O039 Complete or unspecified spontaneous abortion without complication: Secondary | ICD-10-CM | POA: Diagnosis not present

## 2020-12-26 DIAGNOSIS — N939 Abnormal uterine and vaginal bleeding, unspecified: Secondary | ICD-10-CM

## 2020-12-26 LAB — CBC
HCT: 35 % — ABNORMAL LOW (ref 36.0–46.0)
Hemoglobin: 11.5 g/dL — ABNORMAL LOW (ref 12.0–15.0)
MCH: 30 pg (ref 26.0–34.0)
MCHC: 32.9 g/dL (ref 30.0–36.0)
MCV: 91.4 fL (ref 80.0–100.0)
Platelets: 221 10*3/uL (ref 150–400)
RBC: 3.83 MIL/uL — ABNORMAL LOW (ref 3.87–5.11)
RDW: 13.2 % (ref 11.5–15.5)
WBC: 5.4 10*3/uL (ref 4.0–10.5)
nRBC: 0 % (ref 0.0–0.2)

## 2020-12-26 LAB — ABO/RH: ABO/RH(D): O POS

## 2020-12-26 LAB — HCG, QUANTITATIVE, PREGNANCY: hCG, Beta Chain, Quant, S: <1 m[IU]/mL (ref <5)

## 2020-12-26 MED ORDER — ZOLPIDEM TARTRATE 5 MG PO TABS: 5.0000 mg | ORAL_TABLET | Freq: Every evening | ORAL | 0 refills | Status: DC | PRN

## 2020-12-26 MED ORDER — ONDANSETRON 8 MG PO TBDP
8.0000 mg | ORAL_TABLET | Freq: Once | ORAL | Status: AC
  Administered 2020-12-26: 8 mg via ORAL
  Filled 2020-12-26: qty 1

## 2020-12-26 MED ORDER — ONDANSETRON 4 MG PO TBDP: 4.0000 mg | ORAL_TABLET | Freq: Three times a day (TID) | ORAL | 0 refills | Status: DC | PRN

## 2020-12-26 MED ORDER — KETOROLAC TROMETHAMINE 10 MG PO TABS
10.0000 mg | ORAL_TABLET | Freq: Once | ORAL | Status: AC
  Administered 2020-12-26: 10 mg via ORAL
  Filled 2020-12-26: qty 1

## 2020-12-26 MED ORDER — NAPROXEN 500 MG PO TABS: 500.0000 mg | ORAL_TABLET | Freq: Two times a day (BID) | ORAL | 0 refills | Status: DC

## 2020-12-26 NOTE — ED Provider Notes (Addendum)
[facility] Emergency Department Provider Note  ____________________________________________  Time seen: Approximately 12:59 PM  I have reviewed the triage vital signs and the nursing notes.   HISTORY  Chief Complaint Abdominal Pain    HPI Monique Pacheco is a 30 y.o. female with a past history of GERD and ovarian cyst who comes ED complaining of pelvic pain and vaginal bleeding for the past 3 days.  Bleeding initially seemed heavy, soaking through several pads in a day, now has improved.  Denies unusual vaginal discharge dysuria or urgency.  No fevers or chills.  Pain is 6/10 and radiates to the back.  3 days ago the patient took a home pregnancy test which was positive.  Her LMP before this was July 8, and she notes that she has regular periods on a 26-day cycle that normally last for 3 days each.    Past Medical History:  Diagnosis Date   Family history of adverse reaction to anesthesia    Mother bp bottoms out   GERD (gastroesophageal reflux disease)      Patient Active Problem List   Diagnosis Date Noted   History of ovarian cyst 08/30/2019   Intractable pain 11/02/2018     Past Surgical History:  Procedure Laterality Date   FASCIOTOMY Right 11/02/2018   Procedure: FASCIOTOMY RIGHT THIGH;  Surgeon: Juanell FairlyKrasinski, Kevin, MD;  Location: [facility] ORS;  Service: Orthopedics;  Laterality: Right;   INCISION AND DRAINAGE Right 11/05/2018   Procedure: Right fasciotomy closure;  Surgeon: Juanell FairlyKrasinski, Kevin, MD;  Location: [facility] ORS;  Service: Orthopedics;  Laterality: Right;   SECONDARY CLOSURE OF WOUND Right 04/29/2019   Procedure: RIGHT THIGH REPAIR OF FASCIA;  Surgeon: Juanell FairlyKrasinski, Kevin, MD;  Location: [facility] ORS;  Service: Orthopedics;  Laterality: Right;   TONSILLECTOMY       Prior to Admission medications   Medication Sig Start Date End Date Taking? Authorizing Provider  naproxen (NAPROSYN) 500 MG tablet Take 1 tablet (500 mg total) by mouth 2 (two)  times daily with a meal. 12/26/20  Yes Sharman CheekStafford, Duvan Mousel, MD  ondansetron (ZOFRAN ODT) 4 MG disintegrating tablet Take 1 tablet (4 mg total) by mouth every 8 (eight) hours as needed for nausea or vomiting. 12/26/20  Yes Sharman CheekStafford, Tashanti Dalporto, MD  aspirin-acetaminophen-caffeine (EXCEDRIN MIGRAINE) [phone] MG tablet Take 2 tablets by mouth every 8 (eight) hours as needed for headache.    [provider]  Multiple Vitamin (MULTIVITAMIN WITH MINERALS) TABS tablet Take 1 tablet by mouth 2 (two) times daily.    [provider]  norelgestromin-ethinyl estradiol [employer]r Medico(XULANE) 150-35 MCG/24HR transdermal patch Place 1 patch onto the skin once a week. One patch weekly for 3 weeks, then one week off. 09/20/19   Nadara MustardHarris, Robert P, MD  Omega-3 Fatty Acids (FISH OIL) 1000 MG CAPS Take 1,000 mg by mouth daily.    [provider]  omeprazole (PRILOSEC) 20 MG capsule Take 20 mg by mouth daily as needed (acid reflux).    [provider]  OVER THE COUNTER MEDICATION Take 1 capsule by mouth daily. Cellwise otc supplement    [provider]  OVER THE COUNTER MEDICATION Take 1 tablet by mouth 2 (two) times daily. Recover AI otc supplement    [provider]  Probiotic CAPS Take 1 capsule by mouth daily.    [provider]     Allergies Patient has no known allergies.   Family History  Problem Relation Age of Onset   Cancer Maternal Grandmother    Cancer  Maternal Aunt    Ovarian cancer Maternal Aunt 25    Social History Social History   Tobacco Use   Smoking status: Never   Smokeless tobacco: Never  Vaping Use   Vaping Use: Never used  Substance Use Topics   Alcohol use: Yes    Comment: occasionally   Drug use: No    Review of Systems  Constitutional:   No fever or chills.  ENT:   No sore throat. No rhinorrhea. Cardiovascular:   No chest pain or syncope. Respiratory:   No dyspnea or cough. Gastrointestinal: Positive for pelvic pain as above  without vomiting and diarrhea.  Musculoskeletal:   Negative for focal pain or swelling All other systems reviewed and are negative except as documented above in ROS and HPI.  ____________________________________________   PHYSICAL EXAM:  VITAL SIGNS: ED Triage Vitals  Enc Vitals Group     BP 12/26/20 0958 121/79     Pulse Rate 12/26/20 0958 69     Resp 12/26/20 0958 16     Temp 12/26/20 0958 98.5 F (36.9 C)     Temp Source 12/26/20 0958 Oral     SpO2 12/26/20 0958 100 %     Weight 12/26/20 0959 170 lb (77.1 kg)     Height 12/26/20 0959 5\' 7"  (1.702 m)     Head Circumference --      Peak Flow --      Pain Score 12/26/20 0959 7     Pain Loc --      Pain Edu? --      Excl. in GC? --     Vital signs reviewed, nursing assessments reviewed.   Constitutional:   Alert and oriented. Non-toxic appearance. Eyes:   Conjunctivae are normal. EOMI.  ENT      Head:   Normocephalic and atraumatic.      Nose:   Wearing a mask.      Mouth/Throat:   Wearing a mask.      Neck:   No meningismus. Full ROM. Hematological/Lymphatic/Immunilogical:   No cervical lymphadenopathy. Cardiovascular:   RRR. Symmetric bilateral radial and DP pulses.  No murmurs. Cap refill less than 2 seconds. Respiratory:   Normal respiratory effort without tachypnea/retractions. Breath sounds are clear and equal bilaterally. No wheezes/rales/rhonchi. Gastrointestinal:   Soft with suprapubic tenderness . Non distended. There is no CVA tenderness.  No rebound, rigidity, or guarding. Genitourinary:   deferred Musculoskeletal:   Normal range of motion in all extremities. No joint effusions.  No lower extremity tenderness.  No edema. Neurologic:   Normal speech and language.  Motor grossly intact. No acute focal neurologic deficits are appreciated.  Skin:    Skin is warm, dry and intact. No rash noted.  No petechiae, purpura, or bullae.  ____________________________________________    LABS (pertinent  positives/negatives) (all labs ordered are listed, but only abnormal results are displayed) Labs Reviewed  CBC - Abnormal; Notable for the following components:      Result Value   RBC 3.83 (*)    Hemoglobin 11.5 (*)    HCT 35.0 (*)    All other components within normal limits  HCG, QUANTITATIVE, PREGNANCY  ABO/RH   ____________________________________________   EKG    ____________________________________________    RADIOLOGY  02/25/21 PELVIC COMPLETE WITH TRANSVAGINAL  Result Date: 12/26/2020 CLINICAL DATA:  Vaginal bleeding, heavy since Sunday. Negative quantitative beta hCG EXAM: TRANSABDOMINAL AND TRANSVAGINAL ULTRASOUND OF PELVIS TECHNIQUE: Both transabdominal and transvaginal ultrasound examinations of the pelvis were performed.  Transabdominal technique was performed for global imaging of the pelvis including uterus, ovaries, adnexal regions, and pelvic cul-de-sac. It was necessary to proceed with endovaginal exam following the transabdominal exam to visualize the right ovary. COMPARISON:  None FINDINGS: Uterus Measurements: 7.3 x 3.6 x 4.7 cm = volume: 64.4 mL. No fibroids or other mass visualized. Endometrium Thickness: 6.8 mm.  No focal abnormality visualized. Right ovary Measurements: 3.5 x 2.1 x 2.0 cm = volume: 7.6 mL. Normal appearance/no adnexal mass. Left ovary Measurements: 4.2 x 1.9 x 2.6 cm = volume: 11.0 mL. There are small follicles present. There is a small, well-defined isoechoic region measuring 1.4 x 1.0 x 1.2 cm which is favored to represent an involuting cyst. Other findings No abnormal free fluid. IMPRESSION: Unremarkable pelvic ultrasound. Electronically Signed   By: Caprice Renshaw   On: 12/26/2020 12:39    ____________________________________________   PROCEDURES Procedures  ____________________________________________  DIFFERENTIAL DIAGNOSIS   Completed miscarriage, ectopic pregnancy, retained products of conception, ovarian cyst  CLINICAL IMPRESSION /  ASSESSMENT AND PLAN / ED COURSE  Medications ordered in the ED: Medications  ketorolac (TORADOL) tablet 10 mg (10 mg Oral Given 12/26/20 1242)  ondansetron (ZOFRAN-ODT) disintegrating tablet 8 mg (8 mg Oral Given 12/26/20 1243)    Pertinent labs & imaging results that were available during my care of the patient were reviewed by me and considered in my medical decision making (see chart for details).  Abrey Bradway was evaluated in Emergency Department on 12/26/2020 for the symptoms described in the history of present illness. She was evaluated in the context of the global COVID-19 pandemic, which necessitated consideration that the patient might be at risk for infection with the SARS-CoV-2 virus that causes COVID-19. Institutional protocols and algorithms that pertain to the evaluation of patients at risk for COVID-19 are in a state of rapid change based on information released by regulatory bodies including the CDC and federal and state organizations. These policies and algorithms were followed during the patient's care in the ED.   Patient presents with vaginal bleeding in first semester pregnancy with positive home pregnancy test 3 days ago with a gestational age of [redacted] weeks or less.  Vital signs are normal.  Serum hCG is negative.  Hemoglobin is stable.  Ultrasound shows normal anatomy, no retained POC's.  This is all consistent with a completed miscarriage.  She is stable for discharge.   ----------------------------------------- 1:19 PM on 12/26/2020 ----------------------------------------- Discharge counseling, patient requests medication to help her sleep.  Her husband was killed unexpectedly in January of this year in a car accident.  She has had persistent depression symptoms including insomnia, denies SI.  Feels safe at home.  She is starting therapy.  She has tried melatonin without relief.  Counseled her on trying Benadryl, and will prescribe Ambien to take as needed.  Counseled  her on complex sleep behaviors and ensuring a safe environment at home, discontinuing the medication if she notices any evidence of sleepwalking on waking up.     ____________________________________________   FINAL CLINICAL IMPRESSION(S) / ED DIAGNOSES    Final diagnoses:  SAB (spontaneous abortion)  Pelvic pain in female     ED Discharge Orders          Ordered    naproxen (NAPROSYN) 500 MG tablet  2 times daily with meals        12/26/20 1259    ondansetron (ZOFRAN ODT) 4 MG disintegrating tablet  Every 8 hours PRN  12/26/20 1259            Portions of this note were generated with dragon dictation software. Dictation errors may occur despite best attempts at proofreading.    Sharman Cheek, MD 12/26/20 1303    Sharman Cheek, MD 12/26/20 1320

## 2020-12-26 NOTE — ED Notes (Signed)
See triage note  Presents with some vaginal bleeding which started on Sunday  also having some lower abd cramping

## 2020-12-26 NOTE — ED Triage Notes (Signed)
Pt to ED for LLQ pain, vaginal bleeding, dark diarrhea, dizziness that started Sunday, reports possible miscarriage.  Reports going through 1 pad an hour on Sunday, 6 pads yesterday.  +nausea 1 previous miscarriage

## 2020-12-26 NOTE — Discharge Instructions (Addendum)
Your pelvic ultrasound is normal.  Your test today confirm a completed miscarriage.  Please follow-up with your doctor as needed.  Take anti-inflammatory medicines for the next few days as needed.  You can resume normal activity when your pain is improved.

## 2021-03-12 IMAGING — US US PELVIS COMPLETE TRANSABD/TRANSVAG W DUPLEX
1 series · 13 of 25 positions shown · non-contrast
Comparison: None available.

CLINICAL DATA: Initial evaluation for acute pelvic pain for 10
days.

EXAM:
TRANSABDOMINAL AND TRANSVAGINAL ULTRASOUND OF PELVIS
DOPPLER ULTRASOUND OF OVARIES
TECHNIQUE: Both transabdominal and transvaginal ultrasound examinations of the
pelvis were performed. Transabdominal technique was performed for
global imaging of the pelvis including uterus, ovaries, adnexal
regions, and pelvic cul-de-sac.
It was necessary to proceed with endovaginal exam following the
transabdominal exam to visualize the uterus, endometrium, and
ovaries. Color and duplex Doppler ultrasound was utilized to
evaluate blood flow to the ovaries.

[Series 1: us pelvic doppler (torsion right/o or mass arteria · arterial · 13 of 92 slices shown]
[im 1/92]
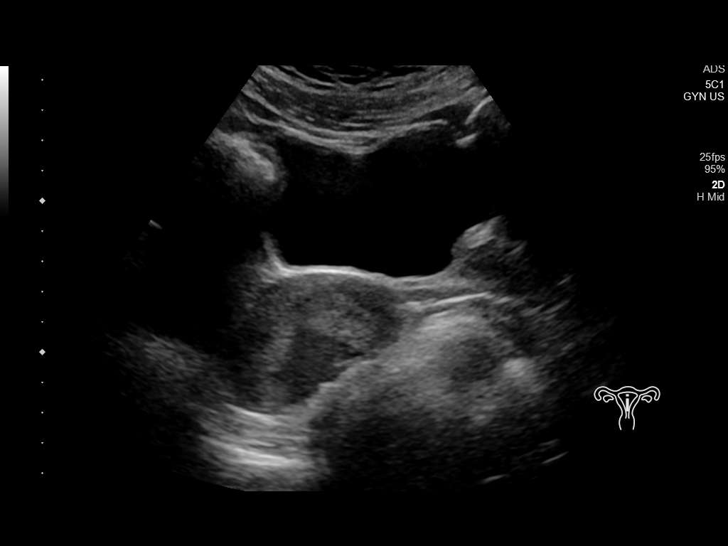
[im 8/92]
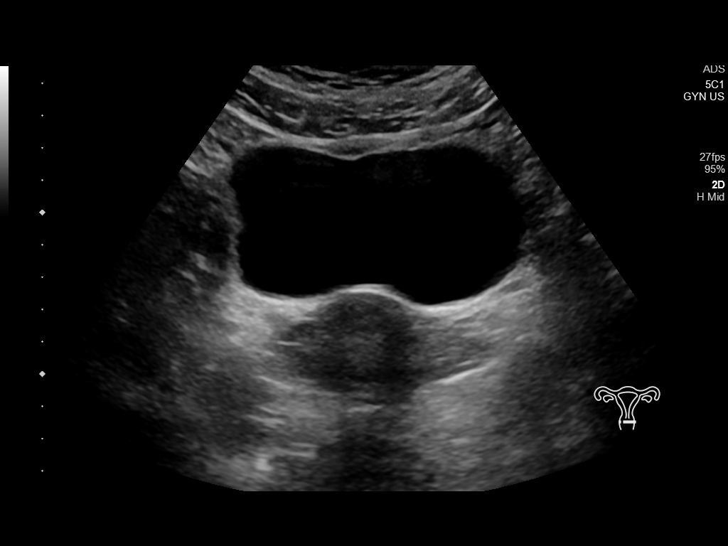
[im 16/92]
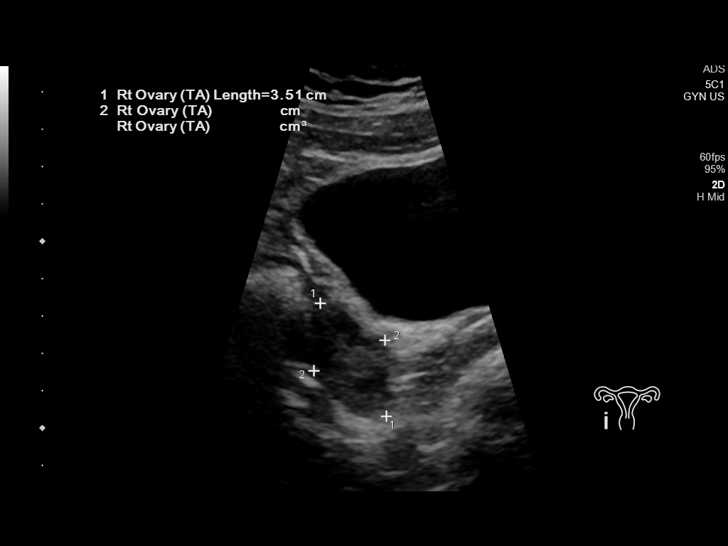
[im 23/92]
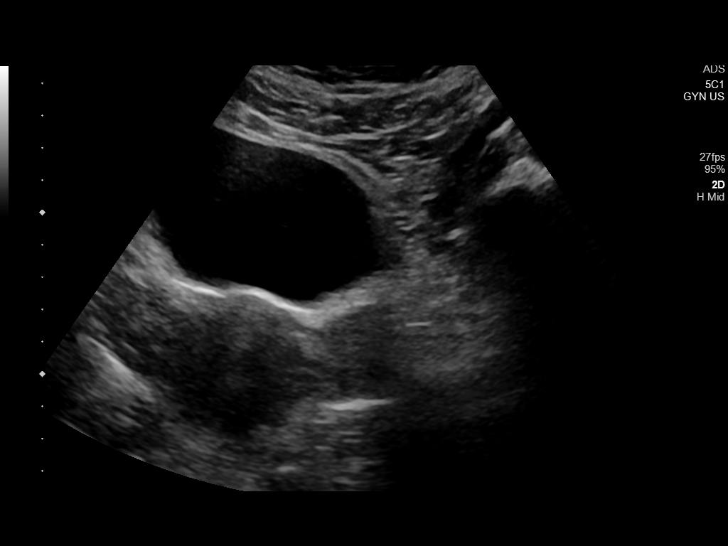
[im 31/92]
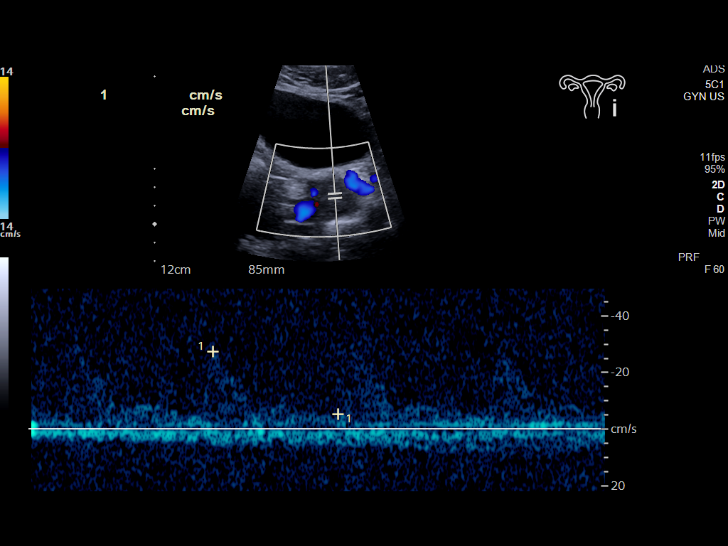
[im 38/92]
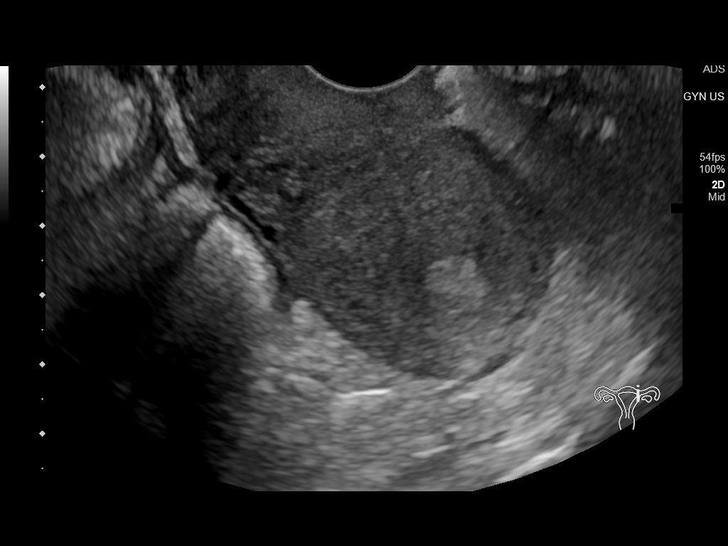
[im 46/92]
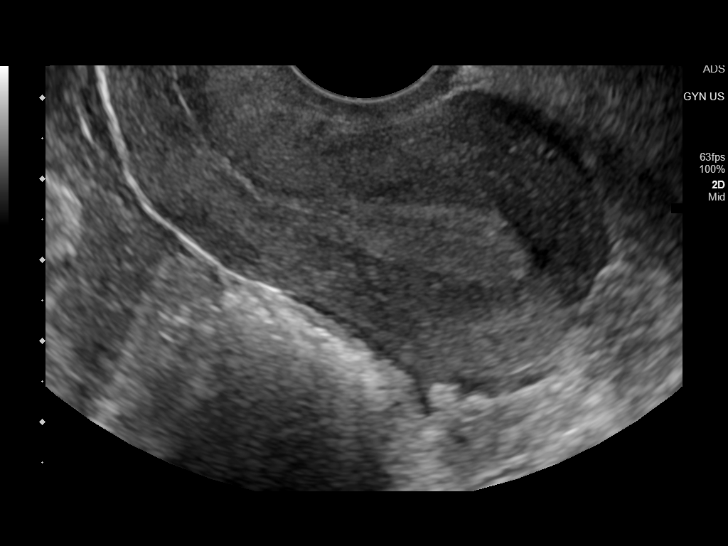
[im 54/92]
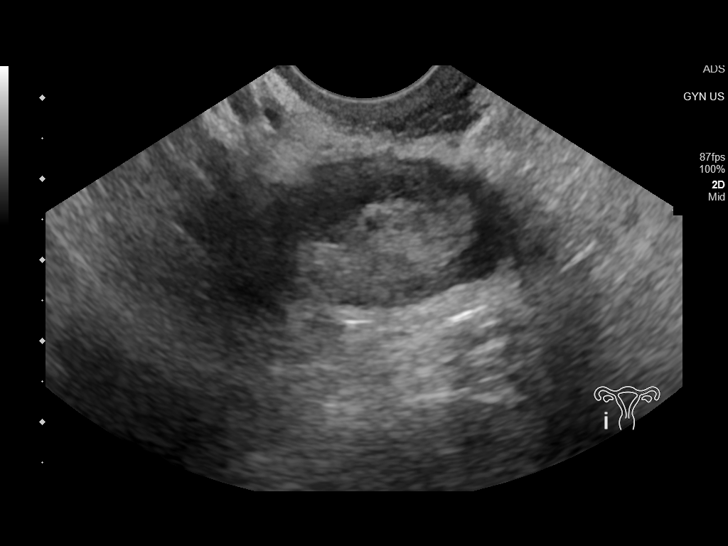
[im 61/92]
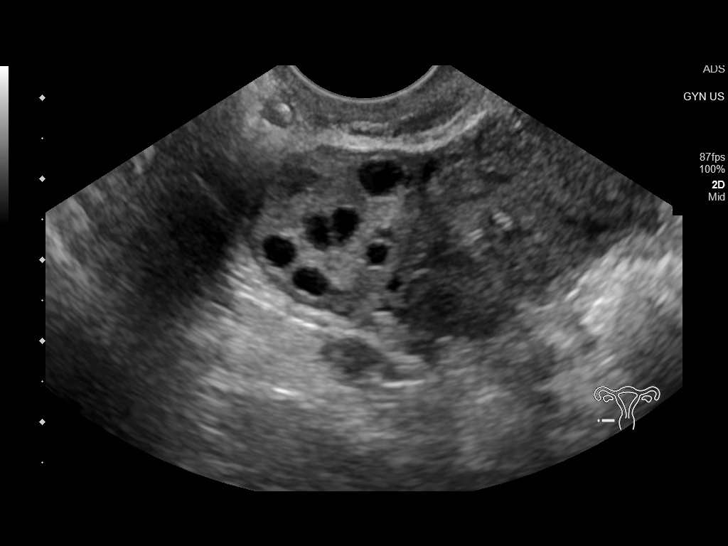
[im 69/92]
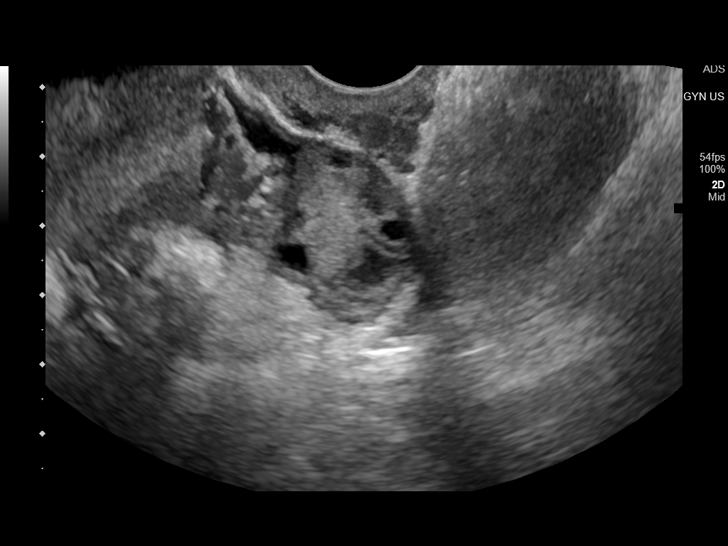
[im 76/92]
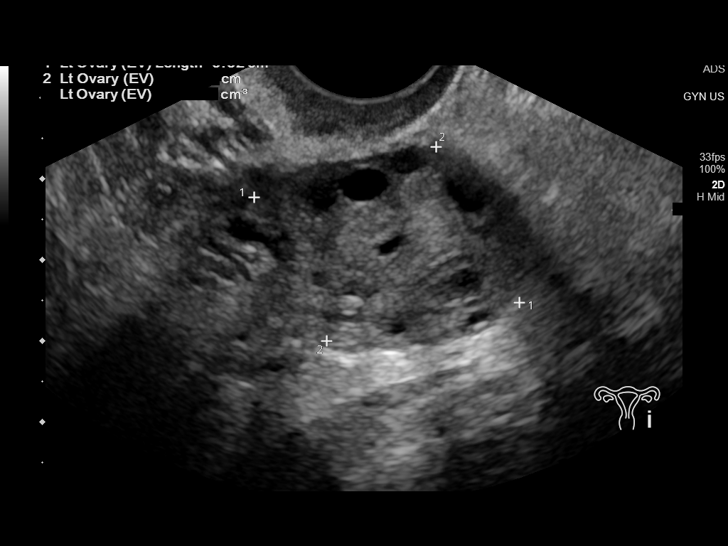
[im 84/92]
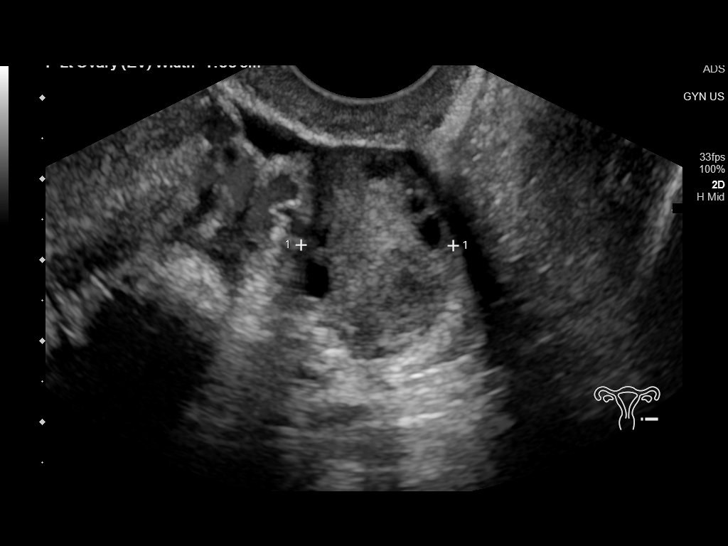
[im 92/92]
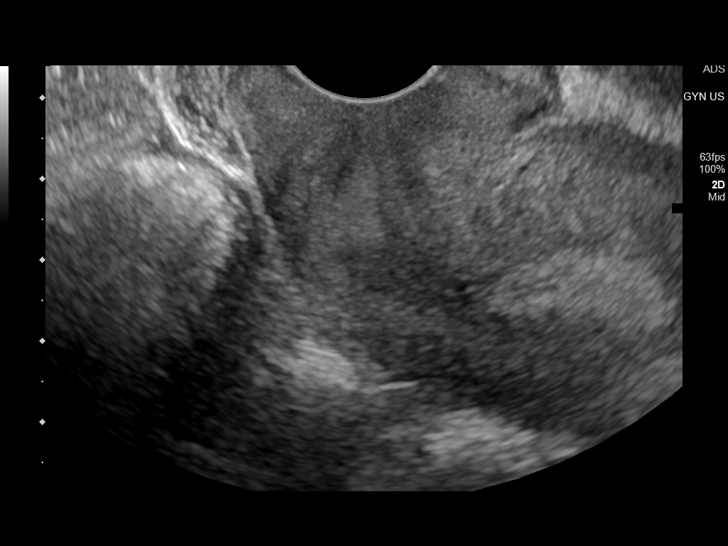

[13 of 25 positions shown; findings below may reference images not displayed]

FINDINGS: Uterus

Measurements: 6.6 x 2.7 x 5.2 cm = volume: 65.6 mL. No fibroids or
other mass visualized.

Endometrium

Thickness: 11.0 mm.  No focal abnormality visualized.

Right ovary

Measurements: 3.2 x 2.0 x 2.3 cm = volume: 7.5 mL. Normal
appearance/no adnexal mass.

Left ovary

Measurements: 3.5 x 2.7 x 1.9 cm = volume: 9.5 mL. Note made of a
1.3 x 1.2 x 1.0 cm complex lesion, most characteristic of a small
degenerating corpus luteal cyst. No other adnexal mass.

Pulsed Doppler evaluation of both ovaries demonstrates normal
low-resistance arterial and venous waveforms.

Other findings

Small volume free fluid within the pelvis.
IMPRESSION: 1. 1.3 cm degenerating left ovarian corpus luteal cyst with
associated small volume free fluid within the pelvis.
2. Otherwise unremarkable and normal pelvic ultrasound. No evidence
for torsion or other acute abnormality.

## 2022-07-30 IMAGING — US US PELVIS COMPLETE WITH TRANSVAGINAL
1 series · 14 of 25 positions shown · non-contrast
Comparison: None

CLINICAL DATA: Vaginal bleeding, heavy since [REDACTED]. Negative
quantitative beta hCG

EXAM:
TRANSABDOMINAL AND TRANSVAGINAL ULTRASOUND OF PELVIS
TECHNIQUE: Both transabdominal and transvaginal ultrasound examinations of the
pelvis were performed. Transabdominal technique was performed for
global imaging of the pelvis including uterus, ovaries, adnexal
regions, and pelvic cul-de-sac. It was necessary to proceed with
endovaginal exam following the transabdominal exam to visualize the
right ovary.

[Series 1: us pelvic complete with transvaginal · 14 of 80 slices shown]
[im 1/80]
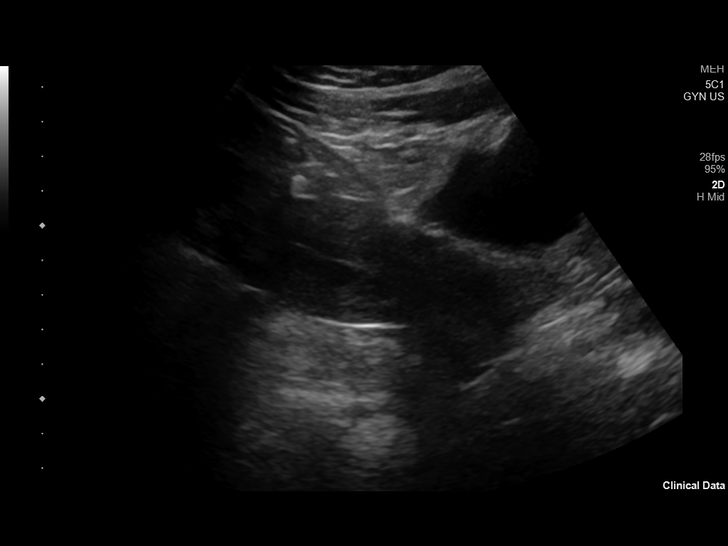
[im 7/80]
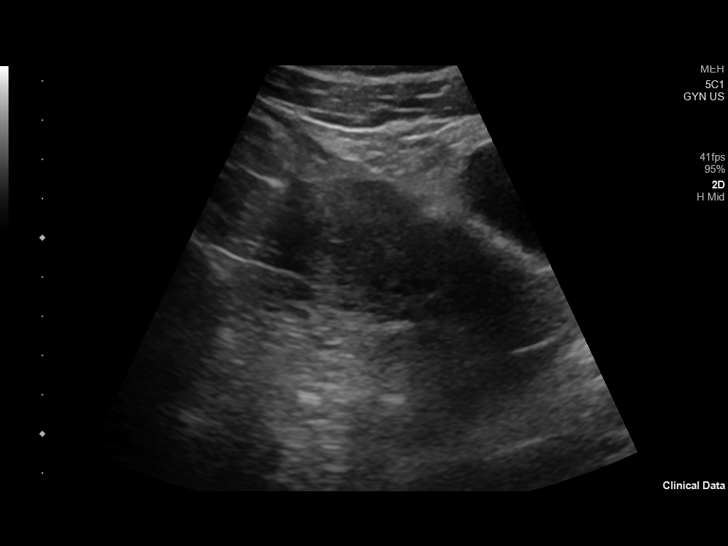
[im 14/80]
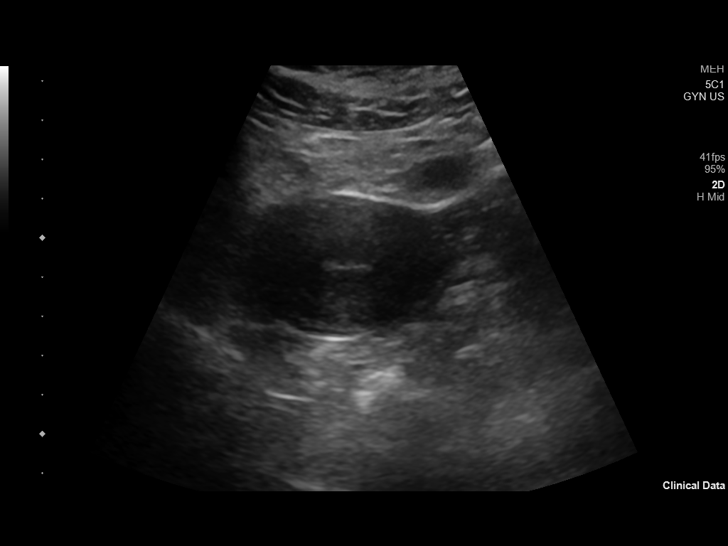
[im 20/80]
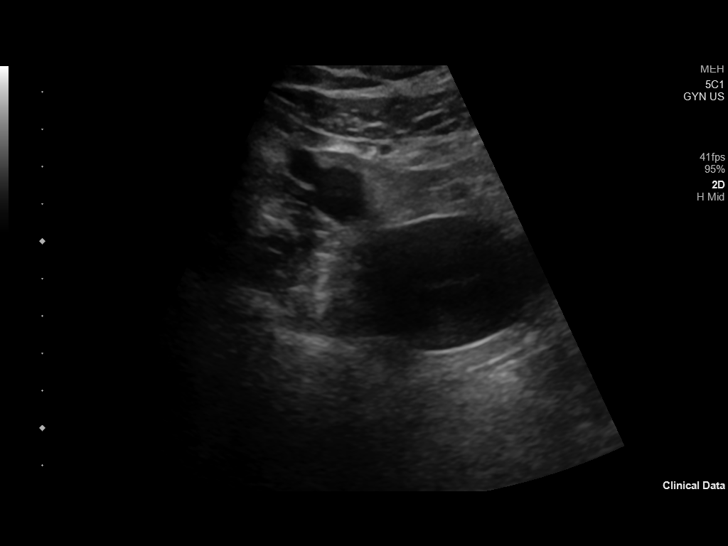
[im 27/80]
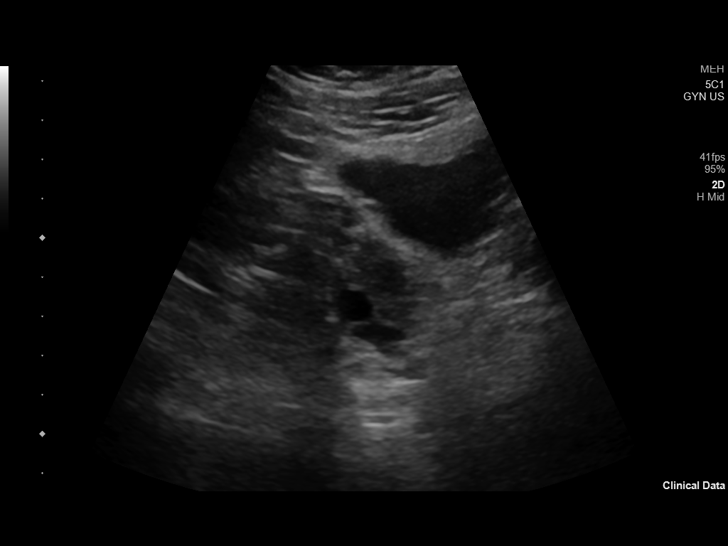
[im 30/80]
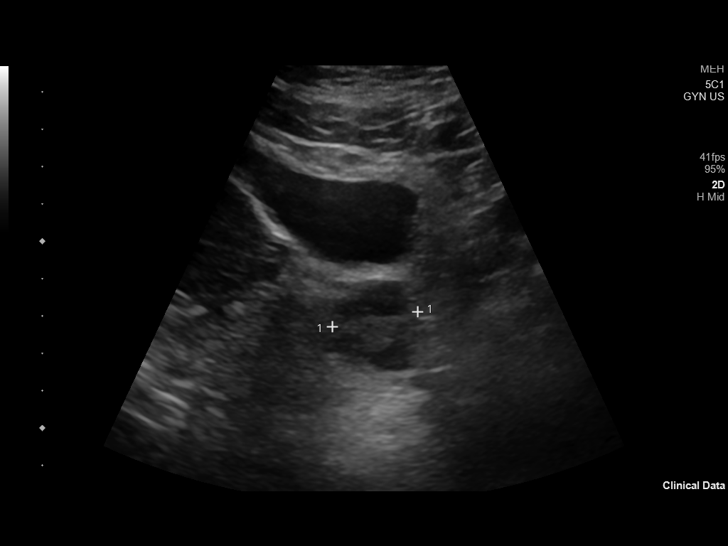
[im 37/80]
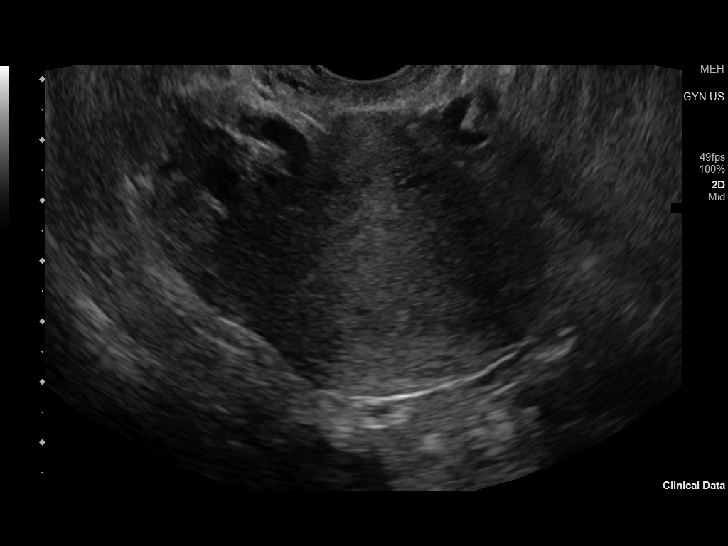
[im 43/80]
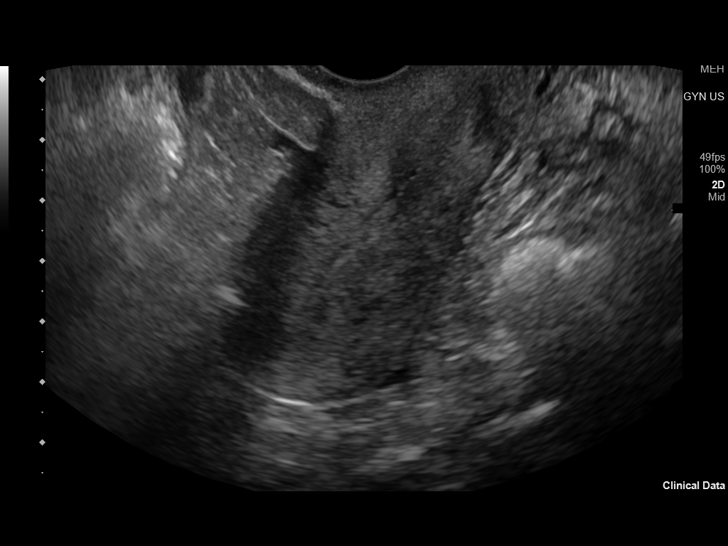
[im 50/80]
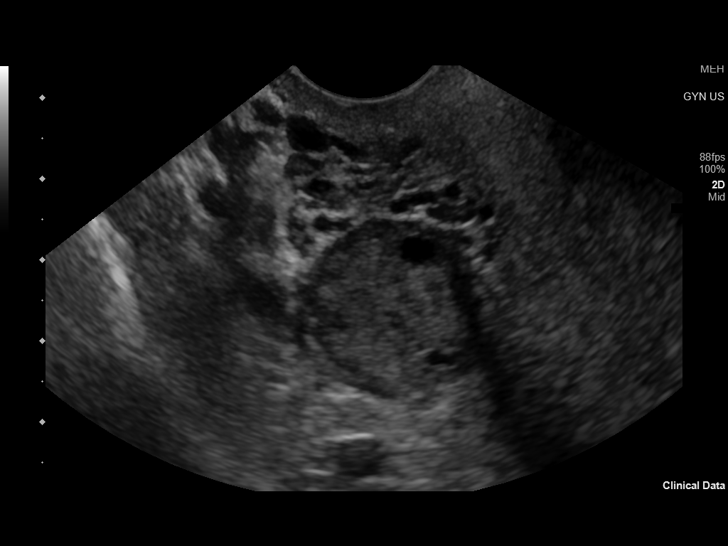
[im 53/80]
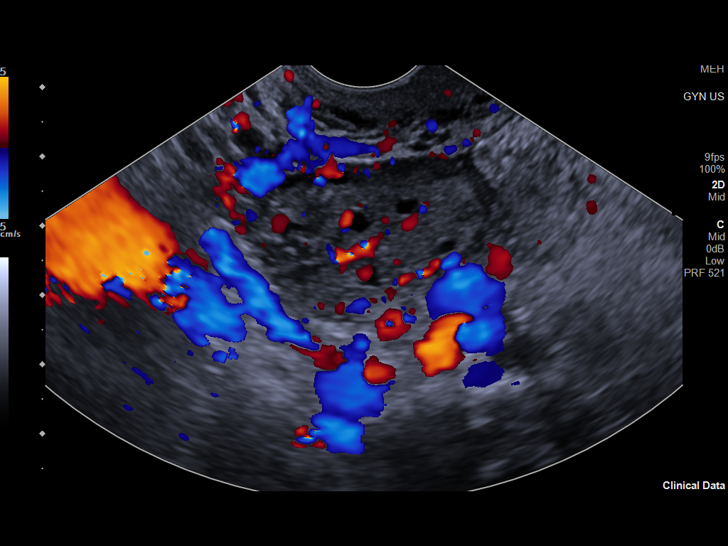
[im 60/80]
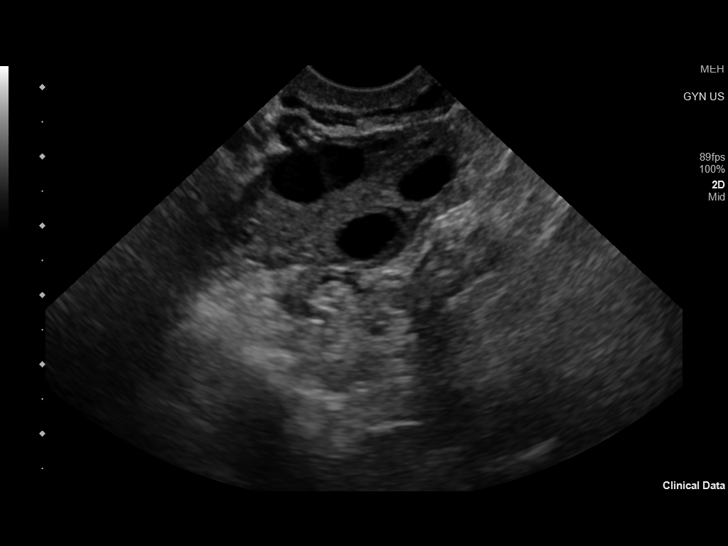
[im 66/80]
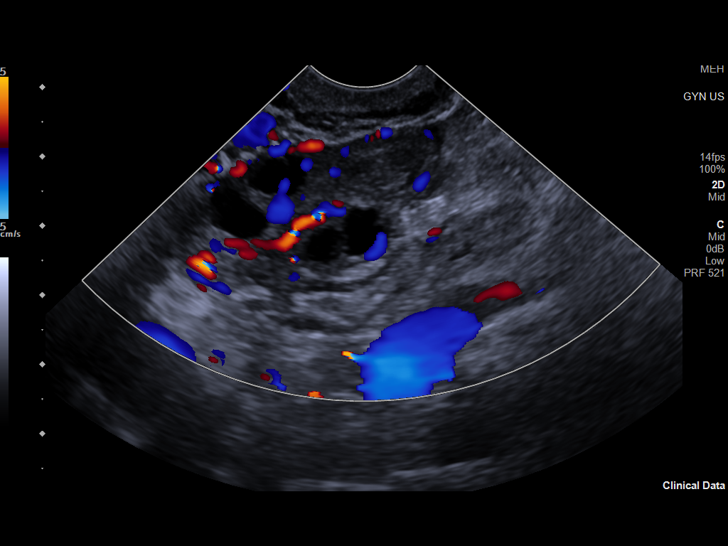
[im 73/80]
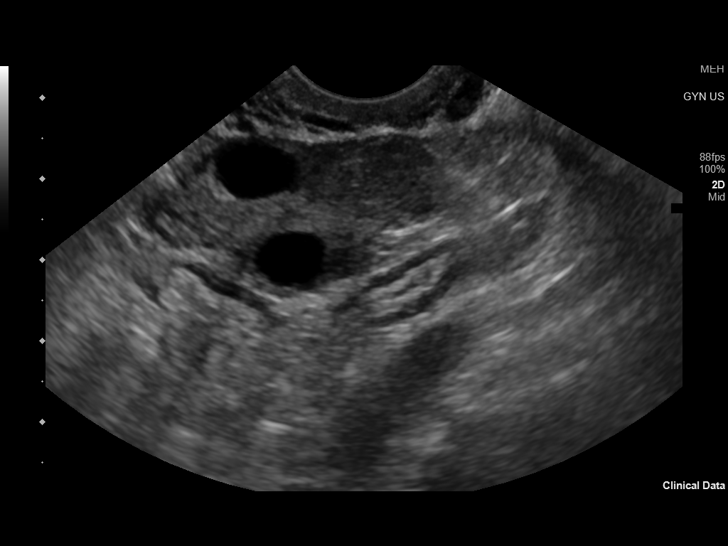
[im 80/80]
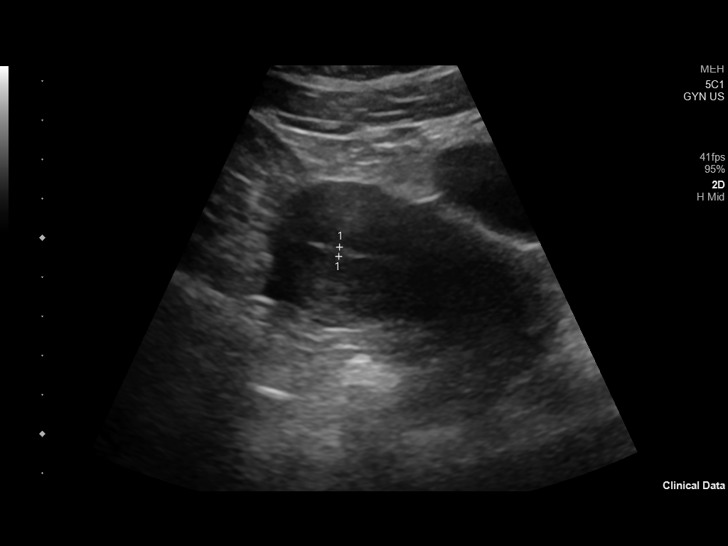

[14 of 25 positions shown; findings below may reference images not displayed]

FINDINGS: Uterus

Measurements: 7.3 x 3.6 x 4.7 cm = volume: 64.4 mL. No fibroids or
other mass visualized.

Endometrium

Thickness: 6.8 mm.  No focal abnormality visualized.

Right ovary

Measurements: 3.5 x 2.1 x 2.0 cm = volume: 7.6 mL. Normal
appearance/no adnexal mass.

Left ovary

Measurements: 4.2 x 1.9 x 2.6 cm = volume: 11.0 mL. There are small
follicles present. There is a small, well-defined isoechoic region
measuring 1.4 x 1.0 x 1.2 cm which is favored to represent an
involuting cyst.

Other findings

No abnormal free fluid.
IMPRESSION: Unremarkable pelvic ultrasound.

## 2023-12-19 ENCOUNTER — Ambulatory Visit (HOSPITAL_BASED_OUTPATIENT_CLINIC_OR_DEPARTMENT_OTHER): Payer: Self-pay

## 2024-01-25 ENCOUNTER — Ambulatory Visit (HOSPITAL_BASED_OUTPATIENT_CLINIC_OR_DEPARTMENT_OTHER)
Admission: RE | Admit: 2024-01-25 | Discharge: 2024-01-25 | Disposition: A | Discharge status: Home / Self Care | Source: Ambulatory Visit | Attending: Family Medicine | Admitting: Family Medicine

## 2024-01-25 ENCOUNTER — Encounter (HOSPITAL_BASED_OUTPATIENT_CLINIC_OR_DEPARTMENT_OTHER): Payer: Self-pay

## 2024-01-25 VITALS — BP 110/71 | HR 104 | Temp 99.1°F | Resp 16

## 2024-01-25 DIAGNOSIS — N644 Mastodynia: Secondary | ICD-10-CM | POA: Diagnosis not present

## 2024-01-25 DIAGNOSIS — N61 Mastitis without abscess: Secondary | ICD-10-CM | POA: Diagnosis not present

## 2024-01-25 DIAGNOSIS — R509 Fever, unspecified: Secondary | ICD-10-CM

## 2024-01-25 MED ORDER — CEPHALEXIN 500 MG PO CAPS: 500.0000 mg | ORAL_CAPSULE | Freq: Four times a day (QID) | ORAL | 0 refills | Status: AC

## 2024-01-25 MED ORDER — CEFTRIAXONE SODIUM 1 G IJ SOLR
1.0000 g | Freq: Once | INTRAMUSCULAR | Status: AC
  Administered 2024-01-25: 1 g via INTRAMUSCULAR

## 2024-01-25 NOTE — Discharge Instructions (Signed)
 Mastitis with breast pain and fever: Educated about comfort measures with up-to-date handout given.  Encouraged warm compresses and pumping breastmilk as often as possible.  May use acetaminophen  or ibuprofen for pain.  Ceftriaxone  1000 mg injection now.  Cephalexin , 500 mg, 1 pill 4 times a day for 7 days.  Get plenty of fluids  Work excuse provided.  Follow-up with OB as needed.  Follow-up here as needed.

## 2024-01-25 NOTE — ED Triage Notes (Addendum)
 mastitis off and on Fever of 102 F, took tylenol  and fever still high  Started at 1 AM this morning  Chills Vomiting, headache, breast pain  Breast is hot to touch, discharge at nipple

## 2024-01-25 NOTE — ED Provider Notes (Signed)
 [facility]    CSN: [id number] Arrival date & time: 01/25/24  1005      History   Chief Complaint Chief Complaint  Patient presents with   Breast Problem    102.8 fever - Entered by patient   mastitis   Fever    HPI Monique Pacheco is a 33 y.o. female.   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  She reports this is her fifth case of mastitis since her child was born.  She had a fever of 102.0 this morning at 1 AM.  She started having breast tenderness yesterday.  She is pumping every 2 hours or even more often.  She is having chills, vomiting, headache.  Her left breast is tender to touch and warm.  She has breast milk from her nipples.  She is not aware of having pus from her nipples.   Fever Associated symptoms: chest pain (Bilateral breast pain with left significantly worse than right.), chills and headaches   Associated symptoms: no cough, no diarrhea, no dysuria, no ear pain, no nausea, no rash, no sore throat and no vomiting     Past Medical History:  Diagnosis Date   Family history of adverse reaction to anesthesia    Mother bp bottoms out   GERD (gastroesophageal reflux disease)     Patient Active Problem List   Diagnosis Date Noted   History of ovarian cyst 08/30/2019   Intractable pain 11/02/2018    Past Surgical History:  Procedure Laterality Date   FASCIOTOMY Right 11/02/2018   Procedure: FASCIOTOMY RIGHT THIGH;  Surgeon: Marchia Drivers, MD;  Location: [facility];  Service: Orthopedics;  Laterality: Right;   INCISION AND DRAINAGE Right 11/05/2018   Procedure: Right fasciotomy closure;  Surgeon: Marchia Drivers, MD;  Location: [facility];  Service: Orthopedics;  Laterality: Right;   SECONDARY CLOSURE OF WOUND Right 04/29/2019   Procedure: RIGHT THIGH REPAIR OF FASCIA;  Surgeon: Marchia Drivers, MD;  Location: [facility];  Service: Orthopedics;  Laterality: Right;   TONSILLECTOMY      OB History     Gravida  2   Para      Term       Preterm      AB  1   Living         SAB  1   IAB      Ectopic      Multiple      Live Births               Home Medications    Prior to Admission medications   Medication Sig Start Date End Date Taking? Authorizing Provider  cephALEXin  (KEFLEX ) 500 MG capsule Take 1 capsule (500 mg total) by mouth 4 (four) times daily for 7 days. 01/25/24 02/01/24 Yes Ival Domino, FNP  aspirin -acetaminophen -caffeine (EXCEDRIN MIGRAINE) 250-250-65 MG tablet Take 2 tablets by mouth every 8 (eight) hours as needed for headache.    [provider]  Multiple Vitamin (MULTIVITAMIN WITH MINERALS) TABS tablet Take 1 tablet by mouth 2 (two) times daily.    [provider]  naproxen  (NAPROSYN ) 500 MG tablet Take 1 tablet (500 mg total) by mouth 2 (two) times daily with a meal. 12/26/20   Viviann Pastor, MD  norelgestromin-ethinyl estradiol (XULANE ) 150-35 MCG/24HR transdermal patch Place 1 patch onto the skin once a week. One patch weekly for 3 weeks, then one week off. 09/20/19   Arloa Lamar SQUIBB, MD  Omega-3 Fatty Acids (FISH OIL) 1000  MG CAPS Take 1,000 mg by mouth daily.    [provider]  omeprazole (PRILOSEC) 20 MG capsule Take 20 mg by mouth daily as needed (acid reflux).    [provider]  ondansetron  (ZOFRAN  ODT) 4 MG disintegrating tablet Take 1 tablet (4 mg total) by mouth every 8 (eight) hours as needed for nausea or vomiting. 12/26/20   Viviann Pastor, MD  OVER THE COUNTER MEDICATION Take 1 capsule by mouth daily. Cellwise otc supplement    [provider]  OVER THE COUNTER MEDICATION Take 1 tablet by mouth 2 (two) times daily. Recover AI otc supplement    [provider]  Probiotic CAPS Take 1 capsule by mouth daily.    [provider]  zolpidem  (AMBIEN ) 5 MG tablet Take 1 tablet (5 mg total) by mouth at bedtime as needed for sleep. 12/26/20   Viviann Pastor, MD    Family History Family History  Problem Relation  Age of Onset   Cancer Maternal Grandmother    Cancer Maternal Aunt    Ovarian cancer Maternal Aunt 68    Social History Social History   Tobacco Use   Smoking status: Never   Smokeless tobacco: Never  Vaping Use   Vaping status: Never Used  Substance Use Topics   Alcohol use: Yes    Comment: occasionally   Drug use: No     Allergies   Patient has no known allergies.   Review of Systems Review of Systems  Constitutional:  Positive for chills and fever.  HENT:  Negative for ear pain and sore throat.   Eyes:  Negative for pain and visual disturbance.  Respiratory:  Negative for cough and shortness of breath.   Cardiovascular:  Positive for chest pain (Bilateral breast pain with left significantly worse than right.). Negative for palpitations.  Gastrointestinal:  Negative for abdominal pain, constipation, diarrhea, nausea and vomiting.  Genitourinary:  Negative for dysuria and hematuria.  Musculoskeletal:  Negative for arthralgias and back pain.  Skin:  Negative for color change and rash.  Neurological:  Positive for headaches. Negative for seizures and syncope.  All other systems reviewed and are negative.    Physical Exam Triage Vital Signs ED Triage Vitals  Encounter Vitals Group     BP 01/25/24 1019 110/71     Girls Systolic BP Percentile --      Girls Diastolic BP Percentile --      Boys Systolic BP Percentile --      Boys Diastolic BP Percentile --      Pulse Rate 01/25/24 1019 (!) 104     Resp 01/25/24 1019 16     Temp 01/25/24 1019 99.1 F (37.3 C)     Temp Source 01/25/24 1019 Oral     SpO2 01/25/24 1019 95 %     Weight --      Height --      Head Circumference --      Peak Flow --      Pain Score 01/25/24 1017 6     Pain Loc --      Pain Education --      Exclude from Growth Chart --    No data found.  Updated Vital Signs BP 110/71 (BP Location: Right Arm)   Pulse (!) 104   Temp 99.1 F (37.3 C) (Oral)   Resp 16   SpO2 95%    Breastfeeding Yes   Visual Acuity Right Eye Distance:   Left Eye Distance:  Bilateral Distance:    Right Eye Near:   Left Eye Near:    Bilateral Near:     Physical Exam Vitals and nursing note reviewed.  Constitutional:      General: She is not in acute distress.    Appearance: She is well-developed. She is not ill-appearing or toxic-appearing.  HENT:     Head: Normocephalic and atraumatic.     Right Ear: Hearing, tympanic membrane, ear canal and external ear normal.     Left Ear: Hearing, tympanic membrane, ear canal and external ear normal.     Nose: No congestion or rhinorrhea.     Right Sinus: No maxillary sinus tenderness or frontal sinus tenderness.     Left Sinus: No maxillary sinus tenderness or frontal sinus tenderness.     Mouth/Throat:     Lips: Pink.     Mouth: Mucous membranes are moist.     Pharynx: Uvula midline. No oropharyngeal exudate or posterior oropharyngeal erythema.     Tonsils: No tonsillar exudate.  Eyes:     Conjunctiva/sclera: Conjunctivae normal.     Pupils: Pupils are equal, round, and reactive to light.  Cardiovascular:     Rate and Rhythm: Normal rate and regular rhythm.     Heart sounds: S1 normal and S2 normal. No murmur heard. Pulmonary:     Effort: Pulmonary effort is normal. No respiratory distress.     Breath sounds: Normal breath sounds. No decreased breath sounds, wheezing, rhonchi or rales.  Chest:  Breasts:    Right: Nipple discharge and tenderness present. No swelling, bleeding, inverted nipple, mass or skin change.     Left: Swelling, nipple discharge, skin change and tenderness present. No bleeding, inverted nipple or mass.     Comments: Bilateral breast engorged with milk but the left breast is slightly larger than the right, the left breast is erythematous on the globe of the breast and tender to touch.  There is no specific abscess in the nipples just show milk as a discharge. Abdominal:     General: Bowel sounds are normal.      Palpations: Abdomen is soft.     Tenderness: There is no abdominal tenderness.  Musculoskeletal:        General: No swelling.     Cervical back: Neck supple.  Lymphadenopathy:     Head:     Right side of head: No submental, submandibular, tonsillar, preauricular or posterior auricular adenopathy.     Left side of head: No submental, submandibular, tonsillar, preauricular or posterior auricular adenopathy.     Cervical: No cervical adenopathy.     Right cervical: No superficial cervical adenopathy.    Left cervical: No superficial cervical adenopathy.  Skin:    General: Skin is warm and dry.     Capillary Refill: Capillary refill takes less than 2 seconds.     Findings: No rash.  Neurological:     Mental Status: She is alert and oriented to person, place, and time.  Psychiatric:        Mood and Affect: Mood normal.      UC Treatments / Results  Labs (all labs ordered are listed, but only abnormal results are displayed) Labs Reviewed - No data to display  EKG   Radiology No results found.  Procedures Procedures (including critical care time)  Medications Ordered in UC Medications  cefTRIAXone  (ROCEPHIN ) injection 1 g (1 g Intramuscular Given 01/25/24 1049)    Initial Impression / Assessment and Plan / UC Course  I have reviewed the triage vital signs and the nursing notes.  Pertinent labs & imaging results that were available during my care of the patient were reviewed by me and considered in my medical decision making (see chart for details).  Plan of Care: Mastitis with breast pain and fever: Ceftriaxone  1000 mg injection now.  Cephalexin  500 mg, 1 pill 4 times daily for 7 days.  Get plenty of fluids and rest.  Use acetaminophen  or ibuprofen for fever and for pain.  May use warm compresses and elevation of breasts for comfort.  Follow-up with obstetrician regarding stopping lactation.  Work excuse provided.  Follow-up if symptoms do not improve, worsen or new  symptoms occur.  I reviewed the plan of care with the patient and/or the patient's guardian.  The patient and/or guardian had time to ask questions and acknowledged that the questions were answered.  I provided instruction on symptoms or reasons to return here or to go to an ER, if symptoms/condition did not improve, worsened or if new symptoms occurred.  Final Clinical Impressions(s) / UC Diagnoses   Final diagnoses:  Acute mastitis of left breast  Fever, unspecified  Breast pain     Discharge Instructions      Mastitis with breast pain and fever: Educated about comfort measures with up-to-date handout given.  Encouraged warm compresses and pumping breastmilk as often as possible.  May use acetaminophen  or ibuprofen for pain.  Ceftriaxone  1000 mg injection now.  Cephalexin , 500 mg, 1 pill 4 times a day for 7 days.  Get plenty of fluids  Work excuse provided.  Follow-up with OB as needed.  Follow-up here as needed.     ED Prescriptions     Medication Sig Dispense Auth. Provider   cephALEXin  (KEFLEX ) 500 MG capsule Take 1 capsule (500 mg total) by mouth 4 (four) times daily for 7 days. 28 capsule Ival Domino, FNP      PDMP not reviewed this encounter.   Ival Domino, FNP 01/25/24 1053

## 2024-06-16 ENCOUNTER — Encounter (HOSPITAL_BASED_OUTPATIENT_CLINIC_OR_DEPARTMENT_OTHER): Payer: Self-pay

## 2024-06-16 ENCOUNTER — Ambulatory Visit (HOSPITAL_BASED_OUTPATIENT_CLINIC_OR_DEPARTMENT_OTHER)
Admission: RE | Admit: 2024-06-16 | Discharge: 2024-06-16 | Disposition: A | Discharge status: Home / Self Care | Source: Ambulatory Visit | Attending: Family Medicine | Admitting: Family Medicine

## 2024-06-16 VITALS — BP 106/71 | HR 93 | Temp 98.5°F | Resp 20

## 2024-06-16 DIAGNOSIS — R051 Acute cough: Secondary | ICD-10-CM | POA: Diagnosis not present

## 2024-06-16 DIAGNOSIS — R509 Fever, unspecified: Secondary | ICD-10-CM | POA: Diagnosis not present

## 2024-06-16 DIAGNOSIS — J111 Influenza due to unidentified influenza virus with other respiratory manifestations: Secondary | ICD-10-CM | POA: Insufficient documentation

## 2024-06-16 DIAGNOSIS — R52 Pain, unspecified: Secondary | ICD-10-CM

## 2024-06-16 DIAGNOSIS — J029 Acute pharyngitis, unspecified: Secondary | ICD-10-CM

## 2024-06-16 LAB — POCT RAPID STREP A (OFFICE): Rapid Strep A Screen: NEGATIVE

## 2024-06-16 LAB — POC SOFIA SARS ANTIGEN FIA: SARS Coronavirus 2 Ag: NEGATIVE

## 2024-06-16 MED ORDER — OSELTAMIVIR PHOSPHATE 75 MG PO CAPS: 75.0000 mg | ORAL_CAPSULE | Freq: Two times a day (BID) | ORAL | 0 refills | Status: AC

## 2024-06-16 MED ORDER — CEPHALEXIN 500 MG PO CAPS: 500.0000 mg | ORAL_CAPSULE | Freq: Four times a day (QID) | ORAL | 0 refills | Status: AC

## 2024-06-16 MED ORDER — LIDOCAINE VISCOUS HCL 2 % MT SOLN: 15.0000 mL | Freq: Four times a day (QID) | OROMUCOSAL | 0 refills | Status: AC | PRN

## 2024-06-16 NOTE — Medical Student Note (Signed)
 "  [facility] Provider Student Note For educational purposes for Medical, PA and NP students only and not part of the legal medical record.   CSN: [id number] Arrival date & time: 06/16/24  9070      History   Chief Complaint Chief Complaint  Patient presents with   Fever    One sided lymph node swelling in neck, pain when swallowing on that side - Entered by patient    HPI Monique Pacheco is a 34 y.o. female with a hx of GERD.  Today she presents with 2 days of HA and then yesterday started having a sore throat and she noticed a swollen, painful lymph node on the R with pain radiating to her R ear and a fever with Tmax of 101.8. She also has a dry non-productive cough. She denies ShOB or N/V/D. She has positive sick contacts with her son who attends daycare.   The history is provided by the patient.  Fever Associated symptoms: cough, ear pain, headaches and sore throat   Associated symptoms: no chest pain, no congestion, no diarrhea, no myalgias, no nausea, no rhinorrhea and no vomiting     Past Medical History:  Diagnosis Date   Family history of adverse reaction to anesthesia    Mother bp bottoms out   GERD (gastroesophageal reflux disease)     Patient Active Problem List   Diagnosis Date Noted   History of ovarian cyst 08/30/2019   Intractable pain 11/02/2018    Past Surgical History:  Procedure Laterality Date   CESAREAN SECTION  10/2023   FASCIOTOMY Right 11/02/2018   Procedure: FASCIOTOMY RIGHT THIGH;  Surgeon: Marchia Drivers, MD;  Location: [facility];  Service: Orthopedics;  Laterality: Right;   INCISION AND DRAINAGE Right 11/05/2018   Procedure: Right fasciotomy closure;  Surgeon: Marchia Drivers, MD;  Location: [facility];  Service: Orthopedics;  Laterality: Right;   SECONDARY CLOSURE OF WOUND Right 04/29/2019   Procedure: RIGHT THIGH REPAIR OF FASCIA;  Surgeon: Marchia Drivers, MD;  Location: [facility];  Service: Orthopedics;  Laterality:  Right;   TONSILLECTOMY      OB History     Gravida  2   Para      Term      Preterm      AB  1   Living         SAB  1   IAB      Ectopic      Multiple      Live Births               Home Medications    Prior to Admission medications  Medication Sig Start Date End Date Taking? Authorizing Provider  Omega-3 Fatty Acids (FISH OIL) 1000 MG CAPS Take 1,000 mg by mouth daily.    [provider]  omeprazole (PRILOSEC) 20 MG capsule Take 20 mg by mouth daily as needed (acid reflux).    [provider]  Probiotic CAPS Take 1 capsule by mouth daily.    [provider]    Family History Family History  Problem Relation Age of Onset   Cancer Maternal Grandmother    Cancer Maternal Aunt    Ovarian cancer Maternal Aunt 63    Social History Social History[1]   Allergies   Fentanyl  and Cyclobenzaprine   Review of Systems Review of Systems  Constitutional:  Positive for fever.  HENT:  Positive for ear pain and sore throat. Negative for congestion, rhinorrhea and sinus  pressure.   Eyes:  Negative for redness.  Respiratory:  Positive for cough. Negative for chest tightness and shortness of breath.   Cardiovascular:  Negative for chest pain.  Gastrointestinal:  Negative for abdominal pain, diarrhea, nausea and vomiting.  Genitourinary:  Negative for difficulty urinating.  Musculoskeletal:  Negative for myalgias.  Neurological:  Positive for headaches.     Physical Exam Updated Vital Signs BP 106/71 (BP Location: Right Arm)   Pulse 93   Temp 98.5 F (36.9 C) (Oral)   Resp 20   LMP 05/23/2024 (Approximate)   SpO2 98%   Breastfeeding No   Physical Exam Vitals and nursing note reviewed.  Constitutional:      Appearance: Normal appearance. She is not ill-appearing.  HENT:     Right Ear: Tympanic membrane, ear canal and external ear normal.     Left Ear: Tympanic membrane, ear canal and external ear normal.     Nose: Nose  normal.     Mouth/Throat:     Mouth: Mucous membranes are moist.     Pharynx: Oropharynx is clear. No oropharyngeal exudate or posterior oropharyngeal erythema.  Eyes:     Conjunctiva/sclera: Conjunctivae normal.     Pupils: Pupils are equal, round, and reactive to light.  Cardiovascular:     Rate and Rhythm: Normal rate and regular rhythm.     Heart sounds: Normal heart sounds. No murmur heard.    No friction rub. No gallop.  Pulmonary:     Effort: Pulmonary effort is normal.     Breath sounds: Normal breath sounds.  Abdominal:     General: Bowel sounds are normal.     Palpations: Abdomen is soft.  Musculoskeletal:     Cervical back: Neck supple.  Lymphadenopathy:     Cervical: Cervical adenopathy (bilateral anterior and posterior adenopathy with a tender lymph node on the R anterior chain) present.  Skin:    General: Skin is warm and dry.  Neurological:     Mental Status: She is alert and oriented to person, place, and time.  Psychiatric:        Mood and Affect: Mood normal.        Behavior: Behavior normal.      ED Treatments / Results  Labs (all labs ordered are listed, but only abnormal results are displayed) Labs Reviewed  POCT RAPID STREP A (OFFICE)    EKG  Radiology No results found.  Procedures Procedures (including critical care time)  Medications Ordered in ED Medications - No data to display   Initial Impression / Assessment and Plan / ED Course  I have reviewed the triage vital signs and the nursing notes.  Pertinent labs & imaging results that were available during my care of the patient were reviewed by me and considered in my medical decision making (see chart for details).    POC rapid strep and COVID19 is negative. Strep culture and is pending.   Sore Throat  Discussed the likely hood of this being a viral illness and given the prevalence of influenza in the community and guidance from the local health department that we can treat  presumptively for the flu. Discussed the low likelihood of strep as her throat was not erythremic and she has had a tonsillectomy, but the possibility was still small. Pt would like to start Tamiflu , but also have a prescription for an antibiotic and requested lidocaine  for her throat. Will start Tamiflu  75mg  twice daily for 5 days. Viscous lidocaine  2% can swish  and swallow prn for throat pain every 6 hours. Cautioned provided not to eat or drink at least 15 minutes after use. Provided a prescription for cephalexin  500 mg 4 times daily for 7 days to start if fever persists. If strep culture comes back negative she will be contacted to not take the antibiotic or stop taking it if she has already started.   Red flag symptoms reviewed and return precautions given.    Final Clinical Impressions(s) / ED Diagnoses   Final diagnoses:  Sore throat    New Prescriptions New Prescriptions   No medications on file       [1]  Social History Tobacco Use   Smoking status: Never   Smokeless tobacco: Never  Vaping Use   Vaping status: Never Used  Substance Use Topics   Alcohol use: Yes    Comment: occasionally   Drug use: No   "

## 2024-06-16 NOTE — ED Provider Notes (Signed)
 " [facility]    CSN: [id number] Arrival date & time: 06/16/24  0929      History   Chief Complaint Chief Complaint  Patient presents with   Fever    One sided lymph node swelling in neck, pain when swallowing on that side - Entered by patient    HPI Monique Pacheco is a 34 y.o. female.   34 year old female with complaint of headaches, fever as high as 101.8, neck pain and maybe even some swelling, cough, nasal congestion, body aches, chills and right ear pain.  Symptoms started on 06/14/2018 6 in the afternoon.  Patient has taken DayQuil and ibuprofen with some relief of symptoms.   Fever Associated symptoms: chills, congestion, cough, ear pain, headaches, rhinorrhea and sore throat   Associated symptoms: no chest pain, no diarrhea, no dysuria, no nausea, no rash and no vomiting     Past Medical History:  Diagnosis Date   Family history of adverse reaction to anesthesia    Mother bp bottoms out   GERD (gastroesophageal reflux disease)     Patient Active Problem List   Diagnosis Date Noted   History of ovarian cyst 08/30/2019   Intractable pain 11/02/2018    Past Surgical History:  Procedure Laterality Date   CESAREAN SECTION  10/2023   FASCIOTOMY Right 11/02/2018   Procedure: FASCIOTOMY RIGHT THIGH;  Surgeon: Marchia Drivers, MD;  Location: [facility];  Service: Orthopedics;  Laterality: Right;   INCISION AND DRAINAGE Right 11/05/2018   Procedure: Right fasciotomy closure;  Surgeon: Marchia Drivers, MD;  Location: [facility];  Service: Orthopedics;  Laterality: Right;   SECONDARY CLOSURE OF WOUND Right 04/29/2019   Procedure: RIGHT THIGH REPAIR OF FASCIA;  Surgeon: Marchia Drivers, MD;  Location: [facility];  Service: Orthopedics;  Laterality: Right;   TONSILLECTOMY      OB History     Gravida  2   Para      Term      Preterm      AB  1   Living         SAB  1   IAB      Ectopic      Multiple      Live Births                Home Medications    Prior to Admission medications  Medication Sig Start Date End Date Taking? Authorizing Provider  cephALEXin  (KEFLEX ) 500 MG capsule Take 1 capsule (500 mg total) by mouth 4 (four) times daily for 7 days. 06/16/24 06/23/24 Yes Ival Domino, FNP  lidocaine  (XYLOCAINE ) 2 % solution Use as directed 15 mLs in the mouth or throat every 6 (six) hours as needed for mouth pain (throat pain). 06/16/24  Yes Ival Domino, FNP  oseltamivir  (TAMIFLU ) 75 MG capsule Take 1 capsule (75 mg total) by mouth every 12 (twelve) hours. 06/16/24  Yes Ival Domino, FNP  Omega-3 Fatty Acids (FISH OIL) 1000 MG CAPS Take 1,000 mg by mouth daily.    [provider]  omeprazole (PRILOSEC) 20 MG capsule Take 20 mg by mouth daily as needed (acid reflux).    [provider]  Probiotic CAPS Take 1 capsule by mouth daily.    [provider]    Family History Family History  Problem Relation Age of Onset   Cancer Maternal Grandmother    Cancer Maternal Aunt    Ovarian cancer Maternal Aunt 59    Social History Social History[1]  Allergies   Fentanyl  and Cyclobenzaprine   Review of Systems Review of Systems  Constitutional:  Positive for chills and fever.  HENT:  Positive for congestion, ear pain, postnasal drip, rhinorrhea, sinus pressure and sore throat.   Eyes:  Negative for pain and visual disturbance.  Respiratory:  Positive for cough. Negative for shortness of breath.   Cardiovascular:  Negative for chest pain and palpitations.  Gastrointestinal:  Negative for abdominal pain, constipation, diarrhea, nausea and vomiting.  Genitourinary:  Negative for dysuria and hematuria.  Musculoskeletal:  Positive for arthralgias. Negative for back pain.  Skin:  Negative for color change and rash.  Neurological:  Positive for headaches. Negative for seizures and syncope.  Hematological:  Positive for adenopathy.  All other systems reviewed and are  negative.    Physical Exam Triage Vital Signs ED Triage Vitals  Encounter Vitals Group     BP 06/16/24 0955 106/71     Girls Systolic BP Percentile --      Girls Diastolic BP Percentile --      Boys Systolic BP Percentile --      Boys Diastolic BP Percentile --      Pulse Rate 06/16/24 0955 93     Resp 06/16/24 0955 20     Temp 06/16/24 0955 98.5 F (36.9 C)     Temp Source 06/16/24 0955 Oral     SpO2 06/16/24 0955 98 %     Weight --      Height --      Head Circumference --      Peak Flow --      Pain Score 06/16/24 0952 6     Pain Loc --      Pain Education --      Exclude from Growth Chart --    No data found.  Updated Vital Signs BP 106/71 (BP Location: Right Arm)   Pulse 93   Temp 98.5 F (36.9 C) (Oral)   Resp 20   LMP 05/23/2024 (Approximate)   SpO2 98%   Breastfeeding No   Visual Acuity Right Eye Distance:   Left Eye Distance:   Bilateral Distance:    Right Eye Near:   Left Eye Near:    Bilateral Near:     Physical Exam Vitals and nursing note reviewed.  Constitutional:      General: She is not in acute distress.    Appearance: She is well-developed. She is not ill-appearing, toxic-appearing or diaphoretic.  HENT:     Head: Normocephalic and atraumatic.     Right Ear: Hearing, tympanic membrane, ear canal and external ear normal.     Left Ear: Hearing, tympanic membrane, ear canal and external ear normal.     Nose: Congestion and rhinorrhea present. Rhinorrhea is clear.     Right Sinus: No maxillary sinus tenderness or frontal sinus tenderness.     Left Sinus: No maxillary sinus tenderness or frontal sinus tenderness.     Mouth/Throat:     Lips: Pink.     Mouth: Mucous membranes are moist.     Pharynx: Uvula midline. Posterior oropharyngeal erythema (Minimal edema of the pharyngeal walls.) present. No oropharyngeal exudate.     Tonsils: No tonsillar exudate (Tonsils are surgically absent and no recurrence at the scar line and no erythema at the  scar line.).  Eyes:     Conjunctiva/sclera: Conjunctivae normal.     Pupils: Pupils are equal, round, and reactive to light.  Cardiovascular:     Rate and  Rhythm: Normal rate and regular rhythm.     Heart sounds: S1 normal and S2 normal. No murmur heard. Pulmonary:     Effort: Pulmonary effort is normal. No respiratory distress.     Breath sounds: Normal breath sounds. No decreased breath sounds, wheezing, rhonchi or rales.  Abdominal:     General: Bowel sounds are normal.     Palpations: Abdomen is soft.     Tenderness: There is no abdominal tenderness.  Musculoskeletal:        General: No swelling.     Cervical back: Neck supple.  Lymphadenopathy:     Head:     Right side of head: Submental, submandibular and tonsillar adenopathy present. No preauricular or posterior auricular adenopathy.     Left side of head: Submental, submandibular and tonsillar adenopathy present. No preauricular or posterior auricular adenopathy.     Cervical: Cervical adenopathy present.     Right cervical: Superficial cervical adenopathy present.     Left cervical: Superficial cervical adenopathy present.  Skin:    General: Skin is warm and dry.     Capillary Refill: Capillary refill takes less than 2 seconds.     Findings: No rash.  Neurological:     Mental Status: She is alert and oriented to person, place, and time.  Psychiatric:        Mood and Affect: Mood normal.      UC Treatments / Results  Labs (all labs ordered are listed, but only abnormal results are displayed) Labs Reviewed  POCT RAPID STREP A (OFFICE) - Normal  POC SOFIA SARS ANTIGEN FIA - Normal  CULTURE, GROUP A STREP [facility])    EKG   Radiology No results found.  Procedures Procedures (including critical care time)  Medications Ordered in UC Medications - No data to display  Initial Impression / Assessment and Plan / UC Course  I have reviewed the triage vital signs and the nursing notes.  Pertinent labs & imaging  results that were available during my care of the patient were reviewed by me and considered in my medical decision making (see chart for details).  Plan of Care (see discharge instructions for additional patient precautions and education): Probable influenza with fever, cough, body aches: COVID is negative.  Tamiflu  75 mg twice daily for 5 days.  Get plenty of fluids and rest.  Sore throat:  Rapid strep is negative.  Throat culture sent.  Will provide a printed prescription for cephalexin , 500 mg, 1 pill 4 times daily with food for 7 days.  Patient may start that if throat pain worsens or fever recurs or Tamiflu  does not seem to be helpful.  If the throat culture is negative she will be called and notified of the results and if she had started the cephalexin  she will be encouraged to stop the cephalexin .  Viscous lidocaine , 15 mL, swish and spit out or could swish and swallow, every 6 hours, if needed for throat pain.  Do not eat or drink for 15 minutes after taking the viscous lidocaine .   Work excuse offered.  Follow-up if symptoms do not improve, worsen or new symptoms occur.  I reviewed the plan of care with the patient and/or the patient's guardian.  The patient and/or guardian had time to ask questions and acknowledged that the questions were answered.  Final Clinical Impressions(s) / UC Diagnoses   Final diagnoses:  Sore throat  Acute cough  Fever, unspecified  Generalized body aches  Influenza  Discharge Instructions      Probable influenza with fever, cough, body aches: COVID is negative.  Tamiflu  75 mg twice daily for 5 days.  Get plenty of fluids and rest.  Sore throat:  Rapid strep is negative.  Throat culture sent.  Will provide a printed prescription for cephalexin , 500 mg, 1 pill 4 times daily with food for 7 days.  Patient may start that if throat pain worsens or fever recurs or Tamiflu  does not seem to be helpful.  If the throat culture is negative she will be called  and notified of the results and if she had started the cephalexin  she will be encouraged to stop the cephalexin .  Viscous lidocaine , 15 mL, swish and spit out or could swish and swallow, every 6 hours, if needed for throat pain.  Do not eat or drink for 15 minutes after taking the viscous lidocaine .   Work excuse offered.  Follow-up if symptoms do not improve, worsen or new symptoms occur.     ED Prescriptions     Medication Sig Dispense Auth. Provider   oseltamivir  (TAMIFLU ) 75 MG capsule Take 1 capsule (75 mg total) by mouth every 12 (twelve) hours. 10 capsule Ival Domino, FNP   lidocaine  (XYLOCAINE ) 2 % solution Use as directed 15 mLs in the mouth or throat every 6 (six) hours as needed for mouth pain (throat pain). 120 mL Ival Domino, FNP   cephALEXin  (KEFLEX ) 500 MG capsule Take 1 capsule (500 mg total) by mouth 4 (four) times daily for 7 days. 28 capsule Ival Domino, FNP      PDMP not reviewed this encounter.    [1]  Social History Tobacco Use   Smoking status: Never   Smokeless tobacco: Never  Vaping Use   Vaping status: Never Used  Substance Use Topics   Alcohol use: Yes    Comment: occasionally   Drug use: No     Ival Domino, FNP 06/16/24 1051  "

## 2024-06-16 NOTE — Discharge Instructions (Addendum)
 Probable influenza with fever, cough, body aches: COVID is negative.  Tamiflu  75 mg twice daily for 5 days.  Get plenty of fluids and rest.  Sore throat:  Rapid strep is negative.  Throat culture sent.  Will provide a printed prescription for cephalexin , 500 mg, 1 pill 4 times daily with food for 7 days.  Patient may start that if throat pain worsens or fever recurs or Tamiflu  does not seem to be helpful.  If the throat culture is negative she will be called and notified of the results and if she had started the cephalexin  she will be encouraged to stop the cephalexin .  Viscous lidocaine , 15 mL, swish and spit out or could swish and swallow, every 6 hours, if needed for throat pain.  Do not eat or drink for 15 minutes after taking the viscous lidocaine .   Work excuse offered.  Follow-up if symptoms do not improve, worsen or new symptoms occur.

## 2024-06-16 NOTE — ED Triage Notes (Signed)
 Pt c/o HA, fever-101.8, right neck pain/swelling, cough-dry, slight nasal congestion, chills, body aches, and right ear pain for the last 2 days. Pt has taken dayquil and ibuprofen with slight relief.

## 2024-06-19 LAB — CULTURE, GROUP A STREP (THRC)

## 2024-06-20 ENCOUNTER — Ambulatory Visit (HOSPITAL_COMMUNITY): Payer: Self-pay

## 2024-06-22 NOTE — Progress Notes (Signed)
 Patient ended up going to the emergency room and was positive for strep and pneumonia at her emergency room visit.  She was started on antibiotics.  She is already feeling better.  The throat culture done on 06/16/2024 was negative.
